# Patient Record
Sex: Female | Born: 1960 | Race: Black or African American | Hispanic: No | Smoking: Current every day smoker
Health system: Southern US, Community
[De-identification: ages and names within clinical notes are randomized; demographics above are authoritative.]

## PROBLEM LIST (undated history)

## (undated) DIAGNOSIS — J45909 Unspecified asthma, uncomplicated: Secondary | ICD-10-CM

## (undated) DIAGNOSIS — Z720 Tobacco use: Secondary | ICD-10-CM

## (undated) DIAGNOSIS — I10 Essential (primary) hypertension: Secondary | ICD-10-CM

## (undated) DIAGNOSIS — M1990 Unspecified osteoarthritis, unspecified site: Secondary | ICD-10-CM

## (undated) DIAGNOSIS — K760 Fatty (change of) liver, not elsewhere classified: Secondary | ICD-10-CM

## (undated) DIAGNOSIS — E079 Disorder of thyroid, unspecified: Secondary | ICD-10-CM

## (undated) HISTORY — DX: Fatty (change of) liver, not elsewhere classified: K76.0

## (undated) HISTORY — PX: COLPOSCOPY: SHX161

## (undated) HISTORY — DX: Unspecified asthma, uncomplicated: J45.909

## (undated) HISTORY — DX: Unspecified osteoarthritis, unspecified site: M19.90

## (undated) HISTORY — DX: Tobacco use: Z72.0

## (undated) HISTORY — PX: TENDON RELEASE: SHX230

## (undated) HISTORY — PX: OTHER SURGICAL HISTORY: SHX169

## (undated) HISTORY — DX: Disorder of thyroid, unspecified: E07.9

## (undated) HISTORY — DX: Essential (primary) hypertension: I10

---

## 2000-05-19 HISTORY — PX: TUBAL LIGATION: SHX77

## 2000-05-19 HISTORY — PX: HERNIA REPAIR: SHX51

## 2004-09-13 ENCOUNTER — Emergency Department: Payer: Self-pay | Admitting: Emergency Medicine

## 2006-08-17 ENCOUNTER — Emergency Department: Payer: Self-pay | Admitting: Emergency Medicine

## 2010-08-13 ENCOUNTER — Ambulatory Visit: Payer: Self-pay | Admitting: Specialist

## 2011-03-28 ENCOUNTER — Ambulatory Visit: Payer: Self-pay | Admitting: Specialist

## 2011-04-04 ENCOUNTER — Ambulatory Visit: Payer: Self-pay | Admitting: Specialist

## 2011-08-15 ENCOUNTER — Emergency Department: Payer: Self-pay | Admitting: Emergency Medicine

## 2012-02-26 ENCOUNTER — Ambulatory Visit: Payer: Self-pay | Admitting: Orthopedic Surgery

## 2013-12-28 ENCOUNTER — Emergency Department: Payer: Self-pay | Admitting: Emergency Medicine

## 2013-12-28 LAB — CK TOTAL AND CKMB (NOT AT ARMC)
CK, Total: 95 U/L
CK-MB: 0.6 ng/mL (ref 0.5–3.6)

## 2013-12-28 LAB — COMPREHENSIVE METABOLIC PANEL
AST: 34 U/L (ref 15–37)
Albumin: 3.5 g/dL (ref 3.4–5.0)
Alkaline Phosphatase: 108 U/L
Anion Gap: 7 (ref 7–16)
BUN: 15 mg/dL (ref 7–18)
Bilirubin,Total: 0.3 mg/dL (ref 0.2–1.0)
CO2: 26 mmol/L (ref 21–32)
Calcium, Total: 8.9 mg/dL (ref 8.5–10.1)
Chloride: 106 mmol/L (ref 98–107)
Creatinine: 0.9 mg/dL (ref 0.60–1.30)
EGFR (African American): >60
EGFR (Non-African Amer.): >60
Glucose: 111 mg/dL — ABNORMAL HIGH (ref 65–99)
OSMOLALITY: 279 (ref 275–301)
Potassium: 4.1 mmol/L (ref 3.5–5.1)
SGPT (ALT): 37 U/L
SODIUM: 139 mmol/L (ref 136–145)
TOTAL PROTEIN: 7.6 g/dL (ref 6.4–8.2)

## 2013-12-28 LAB — CBC
HCT: 39.4 % (ref 35.0–47.0)
HGB: 13.3 g/dL (ref 12.0–16.0)
MCH: 29 pg (ref 26.0–34.0)
MCHC: 33.8 g/dL (ref 32.0–36.0)
MCV: 86 fL (ref 80–100)
Platelet: 190 10*3/uL (ref 150–440)
RBC: 4.59 10*6/uL (ref 3.80–5.20)
RDW: 14 % (ref 11.5–14.5)
WBC: 9.8 10*3/uL (ref 3.6–11.0)

## 2013-12-28 LAB — APTT: Activated PTT: 26.3 secs (ref 23.6–35.9)

## 2013-12-28 LAB — TROPONIN I
Troponin-I: <0.02 ng/mL
Troponin-I: <0.02 ng/mL

## 2013-12-28 LAB — D-DIMER(ARMC): D-Dimer: 1159 ng/ml

## 2014-09-01 ENCOUNTER — Ambulatory Visit: Admit: 2014-09-01 | Disposition: A | Discharge status: Home / Self Care | Payer: Self-pay | Admitting: Family Medicine

## 2014-11-15 ENCOUNTER — Encounter: Payer: Self-pay | Admitting: *Deleted

## 2014-11-16 ENCOUNTER — Ambulatory Visit: Payer: 59 | Admitting: Anesthesiology

## 2014-11-16 ENCOUNTER — Ambulatory Visit
Admission: RE | Admit: 2014-11-16 | Discharge: 2014-11-16 | Disposition: A | Discharge status: Home / Self Care | Payer: 59 | Source: Ambulatory Visit | Attending: Gastroenterology | Admitting: Gastroenterology

## 2014-11-16 ENCOUNTER — Encounter
Admission: RE | Disposition: A | Discharge status: Home / Self Care | Payer: Self-pay | Source: Ambulatory Visit | Attending: Gastroenterology

## 2014-11-16 ENCOUNTER — Encounter: Payer: Self-pay | Admitting: *Deleted

## 2014-11-16 DIAGNOSIS — J449 Chronic obstructive pulmonary disease, unspecified: Secondary | ICD-10-CM | POA: Diagnosis not present

## 2014-11-16 DIAGNOSIS — Z6835 Body mass index (BMI) 35.0-35.9, adult: Secondary | ICD-10-CM | POA: Insufficient documentation

## 2014-11-16 DIAGNOSIS — F17210 Nicotine dependence, cigarettes, uncomplicated: Secondary | ICD-10-CM | POA: Insufficient documentation

## 2014-11-16 DIAGNOSIS — Z79899 Other long term (current) drug therapy: Secondary | ICD-10-CM | POA: Diagnosis not present

## 2014-11-16 DIAGNOSIS — D125 Benign neoplasm of sigmoid colon: Secondary | ICD-10-CM | POA: Diagnosis not present

## 2014-11-16 DIAGNOSIS — D123 Benign neoplasm of transverse colon: Secondary | ICD-10-CM | POA: Insufficient documentation

## 2014-11-16 DIAGNOSIS — I10 Essential (primary) hypertension: Secondary | ICD-10-CM | POA: Diagnosis not present

## 2014-11-16 DIAGNOSIS — K621 Rectal polyp: Secondary | ICD-10-CM | POA: Insufficient documentation

## 2014-11-16 DIAGNOSIS — Z1211 Encounter for screening for malignant neoplasm of colon: Secondary | ICD-10-CM | POA: Insufficient documentation

## 2014-11-16 DIAGNOSIS — D122 Benign neoplasm of ascending colon: Secondary | ICD-10-CM | POA: Diagnosis not present

## 2014-11-16 HISTORY — PX: COLONOSCOPY WITH PROPOFOL: SHX5780

## 2014-11-16 HISTORY — DX: Essential (primary) hypertension: I10

## 2014-11-16 SURGERY — COLONOSCOPY WITH PROPOFOL
Anesthesia: General

## 2014-11-16 MED ORDER — SODIUM CHLORIDE 0.9 % IV SOLN
INTRAVENOUS | Status: DC
  Administered 2014-11-16: 09:00:00 via INTRAVENOUS

## 2014-11-16 MED ORDER — MIDAZOLAM HCL 2 MG/2ML IJ SOLN
INTRAMUSCULAR | Status: DC | PRN
  Administered 2014-11-16: 1 mg via INTRAVENOUS

## 2014-11-16 MED ORDER — FENTANYL CITRATE (PF) 100 MCG/2ML IJ SOLN
INTRAMUSCULAR | Status: DC | PRN
  Administered 2014-11-16: 50 ug via INTRAVENOUS

## 2014-11-16 MED ORDER — PROPOFOL INFUSION 10 MG/ML OPTIME
INTRAVENOUS | Status: DC | PRN
  Administered 2014-11-16: 140 ug/kg/min via INTRAVENOUS

## 2014-11-16 NOTE — Op Note (Signed)
[facility] Patient Name: Brittany Castro Procedure Date: 11/16/2014 9:08 AM MRN: [id number] Account #: [id number] Date of Birth: 13-Aug-1960 Admit Type: Outpatient Age: 54 Room: [facility] ENDO ROOM 4 Gender: Female Note Status: Finalized Procedure:         Colonoscopy Indications:       Screening for colorectal malignant neoplasm Providers:         Lucilla Lame, MD Referring MD:      Bobetta Lime (Referring MD) Medicines:         Propofol per Anesthesia Complications:     No immediate complications. Procedure:         Pre-Anesthesia Assessment:                    - Prior to the procedure, a History and Physical was                     performed, and patient medications and allergies were                     reviewed. The patient's tolerance of previous anesthesia                     was also reviewed. The risks and benefits of the procedure                     and the sedation options and risks were discussed with the                     patient. All questions were answered, and informed consent                     was obtained. Prior Anticoagulants: The patient has taken                     no previous anticoagulant or antiplatelet agents. ASA                     Grade Assessment: II - A patient with mild systemic                     disease. After reviewing the risks and benefits, the                     patient was deemed in satisfactory condition to undergo                     the procedure.                    After obtaining informed consent, the colonoscope was                     passed under direct vision. Throughout the procedure, the                     patient's blood pressure, pulse, and oxygen saturations                     were monitored continuously. The Colonoscope was                     introduced through the anus and advanced to the the cecum,  identified by appendiceal orifice and ileocecal valve. The          colonoscopy was performed without difficulty. The patient                     tolerated the procedure well. The quality of the bowel                     preparation was excellent. Findings:      The perianal and digital rectal examinations were normal.      A 5 mm polyp was found in the ascending colon. The polyp was sessile.       The polyp was removed with a cold snare. Resection and retrieval were       complete.      A 4 mm polyp was found at the hepatic flexure. The polyp was sessile.       The polyp was removed with a cold biopsy forceps. Resection and       retrieval were complete.      A 5 mm polyp was found in the sigmoid colon. The polyp was sessile. The       polyp was removed with a cold snare. Resection and retrieval were       complete.      A 3 mm polyp was found in the rectum. The polyp was sessile. The polyp       was removed with a cold biopsy forceps. Resection and retrieval were       complete. Impression:        - One 5 mm polyp in the ascending colon. Resected and                     retrieved.                    - One 4 mm polyp at the hepatic flexure. Resected and                     retrieved.                    - One 5 mm polyp in the sigmoid colon. Resected and                     retrieved.                    - One 3 mm polyp in the rectum. Resected and retrieved. Recommendation:    - Await pathology results.                    - Repeat colonoscopy in 5 years if polyp adenoma and 10                     years if hyperplastic Procedure Code(s): --- Professional ---                    [phone], Colonoscopy, flexible; with removal of tumor(s),                     polyp(s), or other lesion(s) by snare technique                    73428, 37, Colonoscopy, flexible; with biopsy, single or  multiple Diagnosis Code(s): --- Professional ---                    Z12.11, Encounter for screening for malignant neoplasm of                     colon                     D12.2, Benign neoplasm of ascending colon                    D12.3, Benign neoplasm of transverse colon                    D12.5, Benign neoplasm of sigmoid colon                    K62.1, Rectal polyp CPT copyright 2014 American Medical Association. All rights reserved. The codes documented in this report are preliminary and upon coder review may  be revised to meet current compliance requirements. Lucilla Lame, MD 11/16/2014 9:24:34 AM This report has been signed electronically. Number of Addenda: 0 Note Initiated On: 11/16/2014 9:08 AM Scope Withdrawal Time: 0 hours 6 minutes 55 seconds  Total Procedure Duration: 0 hours 8 minutes 33 seconds       [facility]

## 2014-11-16 NOTE — Transfer of Care (Signed)
Immediate Anesthesia Transfer of Care Note  Patient: Brittany Castro  Procedure(s) Performed: Procedure(s): COLONOSCOPY WITH PROPOFOL (N/A)  Patient Location: PACU  Anesthesia Type:General  Level of Consciousness: awake and sedated  Airway & Oxygen Therapy: Patient Spontanous Breathing and Patient connected to nasal cannula oxygen  Post-op Assessment: Report given to RN and Post -op Vital signs reviewed and stable  Post vital signs: Reviewed and stable  Last Vitals:  Filed Vitals:   11/16/14 0832  BP: 174/111  Pulse: 99  Temp: 35.6 C  Resp: 18    Complications: No apparent anesthesia complications

## 2014-11-16 NOTE — H&P (Signed)
  [facility]  [address], [place], [place] [address] Phone: [phone] Fax : [phone]  Primary Care Physician:  Bobetta Lime, MD Primary Gastroenterologist:  Dr. Allen Norris  Pre-Procedure History & Physical: HPI:  Brittany Castro is a 54 y.o. female is here for a screening colonoscopy.   Past Medical History  Diagnosis Date  . Hypertension     Past Surgical History  Procedure Laterality Date  . Carpel tunnel    . Tendon release      Prior to Admission medications   Medication Sig Start Date End Date Taking? Authorizing Provider  cyclobenzaprine (FLEXERIL) 5 MG tablet Take 5 mg by mouth 3 (three) times daily as needed for muscle spasms.   Yes Historical Provider, MD  hydrochlorothiazide (HYDRODIURIL) 50 MG tablet Take 50 mg by mouth daily.   Yes Historical Provider, MD  lisinopril (PRINIVIL,ZESTRIL) 20 MG tablet Take 20 mg by mouth daily.   Yes Historical Provider, MD  meloxicam (MOBIC) 15 MG tablet Take 15 mg by mouth daily.   Yes Historical Provider, MD    Allergies as of 10/12/2014  . (Not on File)    History reviewed. No pertinent family history.  History   Social History  . Marital Status: Divorced    Spouse Name: N/A  . Number of Children: N/A  . Years of Education: N/A   Occupational History  . Not on file.   Social History Main Topics  . Smoking status: Current Every Day Smoker -- 0.25 packs/day for 20 years    Types: Cigarettes  . Smokeless tobacco: Not on file  . Alcohol Use: No  . Drug Use: No  . Sexual Activity: Not on file   Other Topics Concern  . Not on file   Social History Narrative    Review of Systems: See HPI, otherwise negative ROS  Physical Exam: BP 174/111 mmHg  Pulse 99  Temp(Src) 96 F (35.6 C) (Tympanic)  Resp 18  Ht 5\' 4"  (1.626 m)  Wt 205 lb (92.987 kg)  BMI 35.17 kg/m2  SpO2 99% General:   Alert,  pleasant and cooperative in NAD Head:  Normocephalic and atraumatic. Neck:  Supple; no masses  or thyromegaly. Lungs:  Clear throughout to auscultation.    Heart:  Regular rate and rhythm. Abdomen:  Soft, nontender and nondistended. Normal bowel sounds, without guarding, and without rebound.   Neurologic:  Alert and  oriented x4;  grossly normal neurologically.  Impression/Plan: MAEVA DANT is now here to undergo a screening colonoscopy.  Risks, benefits, and alternatives regarding colonoscopy have been reviewed with the patient.  Questions have been answered.  All parties agreeable.

## 2014-11-16 NOTE — Anesthesia Postprocedure Evaluation (Signed)
  Anesthesia Post-op Note  Patient: Brittany Castro  Procedure(s) Performed: Procedure(s): COLONOSCOPY WITH PROPOFOL (N/A)  Anesthesia type:General  Patient location: PACU  Post pain: Pain level controlled  Post assessment: Post-op Vital signs reviewed, Patient's Cardiovascular Status Stable, Respiratory Function Stable, Patent Airway and No signs of Nausea or vomiting  Post vital signs: Reviewed and stable  Last Vitals:  Filed Vitals:   11/16/14 0832  BP: 174/111  Pulse: 99  Temp: 35.6 C  Resp: 18    Level of consciousness: awake, alert  and patient cooperative  Complications: No apparent anesthesia complications

## 2014-11-16 NOTE — Anesthesia Preprocedure Evaluation (Signed)
Anesthesia Evaluation  Patient identified by MRN, date of birth, ID band Patient confused    Reviewed: Allergy & Precautions, NPO status , Patient's Chart, lab work & pertinent test results  History of Anesthesia Complications Negative for: history of anesthetic complications  Airway Mallampati: III       Dental no notable dental hx. (+) Teeth Intact   Pulmonary COPDCurrent Smoker,    Pulmonary exam normal       Cardiovascular hypertension, Pt. on medications Normal cardiovascular exam    Neuro/Psych negative neurological ROS     GI/Hepatic Neg liver ROS,   Endo/Other  Morbid obesity  Renal/GU   negative genitourinary   Musculoskeletal negative musculoskeletal ROS (+)   Abdominal Normal abdominal exam  (+)   Peds negative pediatric ROS (+)  Hematology   Anesthesia Other Findings   Reproductive/Obstetrics negative OB ROS                             Anesthesia Physical Anesthesia Plan  ASA: III  Anesthesia Plan: General   Post-op Pain Management:    Induction: Intravenous  Airway Management Planned: Nasal Cannula  Additional Equipment:   Intra-op Plan:   Post-operative Plan:   Informed Consent: I have reviewed the patients History and Physical, chart, labs and discussed the procedure including the risks, benefits and alternatives for the proposed anesthesia with the patient or authorized representative who has indicated his/her understanding and acceptance.     Plan Discussed with: CRNA  Anesthesia Plan Comments:         Anesthesia Quick Evaluation

## 2014-11-16 NOTE — Anesthesia Procedure Notes (Signed)
Performed by: COOK-MARTIN, Josph [place] Pre-anesthesia Checklist: Patient identified, Emergency Drugs available, Suction available, Patient being monitored and Timeout performed Patient Re-evaluated:Patient Re-evaluated prior to inductionOxygen Delivery Method: Nasal cannula Preoxygenation: Pre-oxygenation with 100% oxygen Intubation Type: IV induction Placement Confirmation: positive ETCO2 and CO2 detector       

## 2014-11-17 ENCOUNTER — Encounter: Payer: Self-pay | Admitting: Gastroenterology

## 2014-11-21 ENCOUNTER — Encounter: Payer: Self-pay | Admitting: Gastroenterology

## 2014-11-21 LAB — SURGICAL PATHOLOGY

## 2015-03-13 ENCOUNTER — Encounter: Payer: Self-pay | Admitting: Family Medicine

## 2015-03-13 ENCOUNTER — Ambulatory Visit (INDEPENDENT_AMBULATORY_CARE_PROVIDER_SITE_OTHER): Payer: 59 | Admitting: Family Medicine

## 2015-03-13 VITALS — BP 122/80 | HR 91 | Temp 98.0°F | Resp 18 | Wt 213.9 lb

## 2015-03-13 DIAGNOSIS — J069 Acute upper respiratory infection, unspecified: Secondary | ICD-10-CM | POA: Diagnosis not present

## 2015-03-13 DIAGNOSIS — E669 Obesity, unspecified: Secondary | ICD-10-CM

## 2015-03-13 DIAGNOSIS — J029 Acute pharyngitis, unspecified: Secondary | ICD-10-CM | POA: Insufficient documentation

## 2015-03-13 MED ORDER — BENZONATATE 200 MG PO CAPS: 200.0000 mg | ORAL_CAPSULE | Freq: Three times a day (TID) | ORAL | Status: DC | PRN

## 2015-03-13 MED ORDER — PREDNISONE 20 MG PO TABS: 20.0000 mg | ORAL_TABLET | Freq: Two times a day (BID) | ORAL | Status: DC

## 2015-03-13 NOTE — Progress Notes (Signed)
Name: Brittany Castro   MRN: [id number]    DOB: 1961/01/26   Date:03/13/2015       Progress Note  Subjective  Chief Complaint  Chief Complaint  Patient presents with  . URI    patient has had a cold for about 2 weeks. it has been nonresponsive to otc meds.  . Advice Only    patient is here for her BMI screening.    HPI  Patient is here today with concerns regarding the following symptoms congestion, post nasal drip, non productive cough and laryngitis that started about 2 weeks ago.  Associated with fatigue and malaise. Has tried the following home remedies: OTC NyQuil and "home brew with honey and lemon," but her symptoms persists.  She is also requesting [facility] form to be filled out as she did not meet BMI reduction requirements. On review of previous office visits she weighs 208-215 lbs as her typical range. She has not lost any weight. Her diet is reasonably good during breakfast and lunch but her dinner is heavy. She works 3rd shift. Has not made time to exercise. She likes fruits, vegetables but does eat pasta a lot.  Past Medical History  Diagnosis Date  . Hypertension     Social History  Substance Use Topics  . Smoking status: Current Every Day Smoker -- 0.25 packs/day for 20 years    Types: Cigarettes  . Smokeless tobacco: Not on file  . Alcohol Use: No     Current outpatient prescriptions:  .  cyclobenzaprine (FLEXERIL) 5 MG tablet, Take 5 mg by mouth 3 (three) times daily as needed for muscle spasms., Disp: , Rfl:  .  hydrochlorothiazide (HYDRODIURIL) 50 MG tablet, Take 50 mg by mouth daily., Disp: , Rfl:  .  lisinopril (PRINIVIL,ZESTRIL) 20 MG tablet, Take 20 mg by mouth daily., Disp: , Rfl:  .  meloxicam (MOBIC) 15 MG tablet, Take 15 mg by mouth daily., Disp: , Rfl:   No Known Allergies  ROS  CONSTITUTIONAL: No significant weight changes, fever, chills, weakness. Yes fatigue.  HEENT:  - Eyes: No visual changes.  - Ears: No auditory changes. No pain.  -  Nose: Yes sneezing, congestion, runny nose. - Throat: No sore throat. No changes in swallowing. SKIN: No rash or itching.  CARDIOVASCULAR: No chest pain, chest pressure or chest discomfort. No palpitations or edema.  RESPIRATORY: No shortness of breath. Yes cough without sputum.  GASTROINTESTINAL: No anorexia, nausea, vomiting. No changes in bowel habits. No abdominal pain or blood.  MUSCULOSKELETAL: No joint pain. No muscle pain. LYMPHATICS: No enlarged lymph nodes.  PSYCHIATRIC: No change in mood. No change in sleep pattern.     Objective  Filed Vitals:   03/13/15 0952  BP: 122/80  Pulse: 91  Temp: 98 F (36.7 C)  TempSrc: Oral  Resp: 18  Weight: 213 lb 14.4 oz (97.024 kg)  SpO2: 98%   Body mass index is 36.7 kg/(m^2).   Physical Exam  Constitutional: Patient is obese and well-nourished. In no acute distress but does appear to be fatigued from acute illness. HEENT:  - Head: Normocephalic and atraumatic.  - Ears: RIGHT TM bulging with minimal clear exudate, LEFT TM bulging with minimal clear exudate.  - Nose: Nasal mucosa boggy and congested.  - Mouth/Throat: Oropharynx is moist with slight erythema of bilateral tonsils without hypertrophy or exudates. Post nasal drainage present.  - Eyes: Conjunctivae clear, EOM movements normal. PERRLA. No scleral icterus.  Neck: Normal range of motion. Neck supple.  No JVD present. No thyromegaly present. No local lymphadenopathy. Cardiovascular: Regular rate, regular rhythm with no murmurs heard.  Pulmonary/Chest: Effort normal and breath sounds clear in all lung fields.  Musculoskeletal: Normal range of motion bilateral UE and LE, no joint effusions. Skin: Skin is warm and dry. No rash noted. Psychiatric: Patient has a normal mood and affect. Behavior is normal in office today. Judgment and thought content normal in office today.   Assessment & Plan  1. URI (upper respiratory infection) Likely viral etiology. Instructed patient on  increasing hydration, nasal saline spray, steam inhalation, NSAID if tolerated and not contraindicated. If not already doing so start taking daily anti-histamine and use a steroid nasal spray. If symptoms persist/worsen may consider antibiotic therapy.  - benzonatate (TESSALON) 200 MG capsule; Take 1 capsule (200 mg total) by mouth 3 (three) times daily as needed for cough.  Dispense: 30 capsule; Refill: 0 - predniSONE (DELTASONE) 20 MG tablet; Take 1 tablet (20 mg total) by mouth 2 (two) times daily with a meal.  Dispense: 6 tablet; Refill: 0  2. Obesity, Class II, BMI 35-39.9 Recommended at least 5% weight loss in 6 months (about 10 lbs). Recommended 1600 kcal/day diet. The patient has been counseled on their higher than normal BMI.  They have verbally expressed understanding their increased risk for other diseases.  In efforts to meet a better target BMI goal the patient has been counseled on lifestyle, diet and exercise modification tactics. Start with moderate intensity aerobic exercise (walking, jogging, elliptical, swimming, group or individual sports, hiking) at least 15mins a day at least 4 days a week and increase intensity, duration, frequency as tolerated. Diet should include well balance fresh fruits and vegetables avoiding processed foods, carbohydrates and sugars. Drink at least 8oz 10 glasses a day avoiding sodas, sugary fruit drinks, sweetened tea. Check weight on a reliable scale daily and monitor weight loss progress daily. Consider investing in mobile phone apps that will help keep track of weight loss goals.

## 2015-03-13 NOTE — Patient Instructions (Signed)
Fat and Cholesterol Restricted Diet High levels of fat and cholesterol in your blood may lead to various health problems, such as diseases of the heart, blood vessels, gallbladder, liver, and pancreas. Fats are concentrated sources of energy that come in various forms. Certain types of fat, including saturated fat, may be harmful in excess. Cholesterol is a substance needed by your body in small amounts. Your body makes all the cholesterol it needs. Excess cholesterol comes from the food you eat. When you have high levels of cholesterol and saturated fat in your blood, health problems can develop because the excess fat and cholesterol will gather along the walls of your blood vessels, causing them to narrow. Choosing the right foods will help you control your intake of fat and cholesterol. This will help keep the levels of these substances in your blood within normal limits and reduce your risk of disease.   WHAT TYPES OF FAT SHOULD I CHOOSE?  Choose healthy fats more often. Choose monounsaturated and polyunsaturated fats, such as olive and canola oil, flaxseeds, walnuts, almonds, and seeds.  Eat more omega-3 fats. Good choices include salmon, mackerel, sardines, tuna, flaxseed oil, and ground flaxseeds. Aim to eat fish at least two times a week.  Limit saturated fats. Saturated fats are primarily found in animal products, such as meats, butter, and cream. Plant sources of saturated fats include palm oil, palm kernel oil, and coconut oil.  Avoid foods with partially hydrogenated oils in them. These contain trans fats. Examples of foods that contain trans fats are stick margarine, some tub margarines, cookies, crackers, and other baked goods. WHAT GENERAL GUIDELINES DO I NEED TO FOLLOW? These guidelines for healthy eating will help you control your intake of fat and cholesterol:  Check food labels carefully to identify foods with trans fats or high amounts of saturated fat.  Fill one half of your  plate with vegetables and green salads.  Fill one fourth of your plate with whole grains. Look for the word "whole" as the first word in the ingredient list.  Fill one fourth of your plate with lean protein foods.  Limit fruit to two servings a day. Choose fruit instead of juice.  Eat more foods that contain soluble fiber. Examples of foods that contain this type of fiber are apples, broccoli, carrots, beans, peas, and barley. Aim to get 20-30 g of fiber per day.  Eat more home-cooked food and less restaurant, buffet, and fast food.  Limit or avoid alcohol.  Limit foods high in starch and sugar.  Limit fried foods.  Cook foods using methods other than frying. Baking, boiling, grilling, and broiling are all great options.  Lose weight if you are overweight. Losing just 5-10% of your initial body weight can help your overall health and prevent diseases such as diabetes and heart disease. WHAT FOODS CAN I EAT? Grains Whole grains, such as whole wheat or whole grain breads, crackers, cereals, and pasta. Unsweetened oatmeal, bulgur, barley, quinoa, or brown rice. Corn or whole wheat flour tortillas. Vegetables Fresh or frozen vegetables (raw, steamed, roasted, or grilled). Green salads. Fruits All fresh, canned (in natural juice), or frozen fruits. Meat and Other Protein Products Ground beef (85% or leaner), grass-fed beef, or beef trimmed of fat. Skinless chicken or [place]. Ground chicken or [place]. Pork trimmed of fat. All fish and seafood. Eggs. Dried beans, peas, or lentils. Unsalted nuts or seeds. Unsalted canned or dry beans. Dairy Low-fat dairy products, such as skim or 1% milk, 2% or  reduced-fat cheeses, low-fat ricotta or cottage cheese, or plain low-fat yogurt. Fats and Oils Tub margarines without trans fats. Light or reduced-fat mayonnaise and salad dressings. Avocado. Olive, canola, sesame, or safflower oils. Natural peanut or almond butter (choose ones without added sugar and  oil). The items listed above may not be a complete list of recommended foods or beverages. Contact your dietitian for more options. WHAT FOODS ARE NOT RECOMMENDED? Grains White bread. White pasta. White rice. Cornbread. Bagels, pastries, and croissants. Crackers that contain trans fat. Vegetables White potatoes. Corn. Creamed or fried vegetables. Vegetables in a cheese sauce. Fruits Dried fruits. Canned fruit in light or heavy syrup. Fruit juice. Meat and Other Protein Products Fatty cuts of meat. Ribs, chicken wings, bacon, sausage, bologna, salami, chitterlings, fatback, hot dogs, bratwurst, and packaged luncheon meats. Liver and organ meats. Dairy Whole or 2% milk, cream, half-and-half, and cream cheese. Whole milk cheeses. Whole-fat or sweetened yogurt. Full-fat cheeses. Nondairy creamers and whipped toppings. Processed cheese, cheese spreads, or cheese curds. Sweets and Desserts Corn syrup, sugars, honey, and molasses. Candy. Jam and jelly. Syrup. Sweetened cereals. Cookies, pies, cakes, donuts, muffins, and ice cream. Fats and Oils Butter, stick margarine, lard, shortening, ghee, or bacon fat. Coconut, palm kernel, or palm oils. Beverages Alcohol. Sweetened drinks (such as sodas, lemonade, and fruit drinks or punches). The items listed above may not be a complete list of foods and beverages to avoid. Contact your dietitian for more information.   This information is not intended to replace advice given to you by your health care provider. Make sure you discuss any questions you have with your health care provider.   Document Released: 05/05/2005 Document Revised: 05/26/2014 Document Reviewed: 08/03/2013 Elsevier Interactive Patient Education [employer].

## 2015-04-09 ENCOUNTER — Ambulatory Visit: Payer: Self-pay | Admitting: Family Medicine

## 2015-05-22 ENCOUNTER — Ambulatory Visit
Admission: RE | Admit: 2015-05-22 | Discharge: 2015-05-22 | Disposition: A | Discharge status: Home / Self Care | Payer: 59 | Source: Ambulatory Visit | Attending: Family Medicine | Admitting: Family Medicine

## 2015-05-22 ENCOUNTER — Ambulatory Visit (INDEPENDENT_AMBULATORY_CARE_PROVIDER_SITE_OTHER): Payer: 59 | Admitting: Family Medicine

## 2015-05-22 ENCOUNTER — Encounter: Payer: Self-pay | Admitting: Family Medicine

## 2015-05-22 VITALS — BP 124/84 | HR 103 | Temp 98.3°F | Resp 16 | Ht 64.0 in | Wt 211.9 lb

## 2015-05-22 DIAGNOSIS — J40 Bronchitis, not specified as acute or chronic: Secondary | ICD-10-CM | POA: Insufficient documentation

## 2015-05-22 DIAGNOSIS — J209 Acute bronchitis, unspecified: Secondary | ICD-10-CM

## 2015-05-22 DIAGNOSIS — B001 Herpesviral vesicular dermatitis: Secondary | ICD-10-CM | POA: Diagnosis not present

## 2015-05-22 MED ORDER — HYDROCOD POLST-CPM POLST ER 10-8 MG/5ML PO SUER: 5.0000 mL | Freq: Two times a day (BID) | ORAL | Status: DC | PRN

## 2015-05-22 MED ORDER — AMOXICILLIN-POT CLAVULANATE 875-125 MG PO TABS: 1.0000 | ORAL_TABLET | Freq: Two times a day (BID) | ORAL | Status: DC

## 2015-05-22 MED ORDER — PREDNISONE 10 MG (21) PO TBPK: ORAL_TABLET | ORAL | Status: DC

## 2015-05-22 NOTE — Patient Instructions (Signed)

## 2015-05-22 NOTE — Progress Notes (Signed)
Name: Brittany Castro   MRN: [id number]    DOB: [birth year]   Date:05/22/2015       Progress Note  Subjective  Chief Complaint  Chief Complaint  Patient presents with  . URI    HPI  Patient is here today with concerns regarding the following symptoms congestion, sneezing, productive cough and achiness that started 6 days ago.  Associated with chills, fatigue and malaise. Not associated with fever, recent travel or rash. Hs not tried anything OTC.   Today onset of lesion on upper lip and lower lip.   Past Medical History  Diagnosis Date  . Hypertension     Social History  Substance Use Topics  . Smoking status: Current Every Day Smoker -- 0.25 packs/day for 20 years    Types: Cigarettes  . Smokeless tobacco: Not on file  . Alcohol Use: No     Current outpatient prescriptions:  .  cyclobenzaprine (FLEXERIL) 5 MG tablet, Take 5 mg by mouth 3 (three) times daily as needed for muscle spasms., Disp: , Rfl:  .  hydrochlorothiazide (HYDRODIURIL) 50 MG tablet, Take 50 mg by mouth daily., Disp: , Rfl:  .  lisinopril (PRINIVIL,ZESTRIL) 20 MG tablet, Take 20 mg by mouth daily., Disp: , Rfl:   No Known Allergies  ROS  Positive for fatigue, nasal congestion, sinus pressure, ear fullness, cough as mentioned in HPI, otherwise all systems reviewed and are negative.  Objective  Filed Vitals:   05/22/15 1441  BP: 124/84  Pulse: 103  Temp: 98.3 F (36.8 C)  TempSrc: Oral  Resp: 16  Height: 5\' 4"  (1.626 m)  Weight: 211 lb 14.4 oz (96.117 kg)  SpO2: 96%   Body mass index is 36.35 kg/(m^2).   Physical Exam  Constitutional: Patient appears well-developed and well-nourished. In no acute distress but does appear to be fatigued from acute illness. HEENT:  - Head: Normocephalic and atraumatic. Vesicular lesion left upper lip border and right lower lip border. - Ears: RIGHT TM bulging with minimal clear exudate, LEFT TM bulging with minimal clear exudate.  - Nose: Nasal mucosa boggy  and congested.  - Mouth/Throat: Oropharynx is moist with slight erythema of bilateral tonsils without hypertrophy or exudates. Post nasal drainage present.  - Eyes: Conjunctivae clear, EOM movements normal. PERRLA. No scleral icterus.  Neck: Normal range of motion. Neck supple. No JVD present. No thyromegaly present. No local lymphadenopathy. Cardiovascular: Regular rate, regular rhythm with no murmurs heard.  Pulmonary/Chest: Effort normal and breath sounds decreased in lung fields with scattered rhonchi. Musculoskeletal: Normal range of motion bilateral UE and LE, no joint effusions. Skin: Skin is warm and dry. No rash noted. Psychiatric: Patient has a normal mood and affect. Behavior is normal in office today. Judgment and thought content normal in office today.   Assessment & Plan  1. Bronchitis with bronchospasm Increase hydration, rest, NSAID or Tylenol for fevers and aches. Work note provided. Start prednisone and abx. Get CXR to rule out PNA. If wheezing continues after using prednisone for 3 days she may call me for inhaler.   - amoxicillin-clavulanate (AUGMENTIN) 875-125 MG tablet; Take 1 tablet by mouth 2 (two) times daily.  Dispense: 20 tablet; Refill: 0 - chlorpheniramine-HYDROcodone (TUSSIONEX PENNKINETIC ER) 10-8 MG/5ML SUER; Take 5 mLs by mouth every 12 (twelve) hours as needed.  Dispense: 115 mL; Refill: 0 - predniSONE (STERAPRED UNI-PAK 21 TAB) 10 MG (21) TBPK tablet; Use as directed in a 6 day taper Pred Pak  Dispense: 21 tablet; Refill:  0 - DG Chest 2 View; Future  2. Cold sore Likely has outbreak due to acute illness. Advised on using OTC abreva or equivalent. If ongoing may call me for valtrex.

## 2015-07-03 ENCOUNTER — Encounter: Payer: Self-pay | Admitting: Family Medicine

## 2015-07-03 ENCOUNTER — Ambulatory Visit (INDEPENDENT_AMBULATORY_CARE_PROVIDER_SITE_OTHER): Payer: 59 | Admitting: Family Medicine

## 2015-07-03 VITALS — BP 130/80 | HR 105 | Temp 98.6°F | Resp 16 | Ht 64.0 in | Wt 211.9 lb

## 2015-07-03 DIAGNOSIS — R1011 Right upper quadrant pain: Secondary | ICD-10-CM

## 2015-07-03 DIAGNOSIS — E669 Obesity, unspecified: Secondary | ICD-10-CM | POA: Diagnosis not present

## 2015-07-03 LAB — COMPREHENSIVE METABOLIC PANEL
ALK PHOS: 125 IU/L — AB (ref 39–117)
ALT: 28 IU/L (ref 0–32)
AST: 24 IU/L (ref 0–40)
Albumin/Globulin Ratio: 1.6 (ref 1.1–2.5)
Albumin: 4.5 g/dL (ref 3.5–5.5)
BUN/Creatinine Ratio: 11 (ref 9–23)
BUN: 9 mg/dL (ref 6–24)
Bilirubin Total: 0.4 mg/dL (ref 0.0–1.2)
CO2: 23 mmol/L (ref 18–29)
Calcium: 9.4 mg/dL (ref 8.7–10.2)
Chloride: 103 mmol/L (ref 96–106)
Creatinine, Ser: 0.8 mg/dL (ref 0.57–1.00)
GFR calc Af Amer: 97 mL/min/{1.73_m2} (ref >59)
GFR calc non Af Amer: 84 mL/min/{1.73_m2} (ref >59)
GLUCOSE: 117 mg/dL — AB (ref 65–99)
Globulin, Total: 2.8 g/dL (ref 1.5–4.5)
Potassium: 4.4 mmol/L (ref 3.5–5.2)
SODIUM: 142 mmol/L (ref 134–144)
Total Protein: 7.3 g/dL (ref 6.0–8.5)

## 2015-07-03 LAB — CBC WITH DIFFERENTIAL/PLATELET
BASOS ABS: 0 10*3/uL (ref 0.0–0.2)
Basos: 0 %
EOS (ABSOLUTE): 0.1 10*3/uL (ref 0.0–0.4)
Eos: 1 %
Hematocrit: 41.3 % (ref 34.0–46.6)
Hemoglobin: 14.4 g/dL (ref 11.1–15.9)
Immature Grans (Abs): 0 10*3/uL (ref 0.0–0.1)
Immature Granulocytes: 0 %
LYMPHS ABS: 2.7 10*3/uL (ref 0.7–3.1)
Lymphs: 25 %
MCH: 29.1 pg (ref 26.6–33.0)
MCHC: 34.9 g/dL (ref 31.5–35.7)
MCV: 84 fL (ref 79–97)
MONOCYTES: 7 %
Monocytes Absolute: 0.8 10*3/uL (ref 0.1–0.9)
Neutrophils Absolute: 7 10*3/uL (ref 1.4–7.0)
Neutrophils: 67 %
Platelets: 200 10*3/uL (ref 150–379)
RBC: 4.94 x10E6/uL (ref 3.77–5.28)
RDW: 15 % (ref 12.3–15.4)
WBC: 10.6 10*3/uL (ref 3.4–10.8)

## 2015-07-03 NOTE — Progress Notes (Signed)
Name: Brittany Castro   MRN: [id number]    DOB: 10-01-1960   Date:07/03/2015       Progress Note  Subjective  Chief Complaint  Chief Complaint  Patient presents with  . Abdominal Pain    RUQ onset several months and worsening. Patient states it feels like a catching when she turns a certain way.    HPI  Brittany Castro is a 55 year old female with complaints of several weeks of progressively worsening RUQ abdominal pain. Described as crampy and sharp. Initially the pain would come and go but now it is more constant. Not associated with nausea, vomiting, change in bowel movements but she does not have the same appetite. Working night shifts, pain is there day and night. Diet consists of high carb, fat foods.    Past Medical History  Diagnosis Date  . Hypertension     Patient Active Problem List   Diagnosis Date Noted  . Bronchitis with bronchospasm 05/22/2015  . Cold sore 05/22/2015  . Obesity, Class II, BMI 35-39.9 03/13/2015  . Special screening for malignant neoplasms, colon   . Benign neoplasm of ascending colon   . Benign neoplasm of transverse colon   . Benign neoplasm of sigmoid colon   . Rectal polyp     Social History  Substance Use Topics  . Smoking status: Current Every Day Smoker -- 0.25 packs/day for 20 years    Types: Cigarettes  . Smokeless tobacco: Not on file  . Alcohol Use: No   Past Surgical History  Procedure Laterality Date  . Carpel tunnel    . Tendon release    . Colonoscopy with propofol N/A 11/16/2014    Procedure: COLONOSCOPY WITH PROPOFOL;  Surgeon: Lucilla Lame, MD;  Location: [facility] ENDOSCOPY;  Service: Endoscopy;  Laterality: N/A;    No current outpatient prescriptions on file.  No Known Allergies  Review of Systems  CONSTITUTIONAL: No significant weight changes, fever, chills, weakness or fatigue.  SKIN: No rash or itching.  CARDIOVASCULAR: No chest pain, chest pressure or chest discomfort. No palpitations or edema.  RESPIRATORY: No  shortness of breath, cough or sputum.  GASTROINTESTINAL: No nausea, vomiting. No changes in bowel habits. Yes abdominal pain. GENITOURINARY: No dysuria. No frequency. No discharge. NEUROLOGICAL: No headache, dizziness, syncope, paralysis, ataxia, numbness or tingling in the extremities. No memory changes. No change in bowel or bladder control.    Objective  BP 130/80 mmHg  Pulse 105  Temp(Src) 98.6 F (37 C) (Oral)  Resp 16  Ht 5\' 4"  (1.626 m)  Wt 211 lb 14.4 oz (96.117 kg)  BMI 36.35 kg/m2  SpO2 97%  Body mass index is 36.35 kg/(m^2).   Physical Exam  Constitutional: Patient is obese and well-nourished. In no distress.   Neck: Normal range of motion. Neck supple. No JVD present. No thyromegaly present.  Cardiovascular: Normal rate, regular rhythm and normal heart sounds.  No murmur heard.  Pulmonary/Chest: Effort normal and breath sounds normal. No respiratory distress. Abdomen: Soft, obese, midline ventral hernia on bearing down only, tenderness RUQ with guarding and rebound, bowel sounds normal.  Musculoskeletal: Normal range of motion bilateral UE and LE, no joint effusions. Peripheral vascular: Bilateral LE no edema. Neurological: CN II-XII grossly intact with no focal deficits. Alert and oriented to person, place, and time. Coordination, balance, strength, speech and gait are normal.  Skin: Skin is warm and dry. No rash noted. No erythema.  Psychiatric: Patient has an anxious mood and affect. Behavior is normal  in office today. Judgment and thought content normal in office today.    Assessment & Plan  1. Right upper quadrant abdominal pain Symptoms suggestive of gallbladder pathology. Diet modification, RUQ [place] and stat labs.   - US Abdomen Limited RUQ; Future - CBC with Differential/Platelet - Comprehensive metabolic panel  2. Obesity, Class II, BMI 35-39.9 Increased risk for gallbladder pathology, fatty liver, cardiovascular disorders. Needs to lose weight once  abdominal pain issues resolved.

## 2015-07-03 NOTE — Patient Instructions (Signed)
Low-Fat Diet for Pancreatitis or Gallbladder Conditions °A low-fat diet can be helpful if you have pancreatitis or a gallbladder condition. With these conditions, your pancreas and gallbladder have trouble digesting fats. A healthy eating plan with less fat will help rest your pancreas and gallbladder and reduce your symptoms. °WHAT DO I NEED TO KNOW ABOUT THIS DIET? °· Eat a low-fat diet. °¨ Reduce your fat intake to less than 20-30% of your total daily calories. This is less than 50-60 g of fat per day. °¨ Remember that you need some fat in your diet. Ask your dietician what your daily goal should be. °¨ Choose nonfat and low-fat healthy foods. Look for the words "nonfat," "low fat," or "fat free." °¨ As a guide, look on the label and choose foods with less than 3 g of fat per serving. Eat only one serving. °· Avoid alcohol. °· Do not smoke. If you need help quitting, talk with your health care provider. °· Eat small frequent meals instead of three large heavy meals. °WHAT FOODS CAN I EAT? °Grains °Include healthy grains and starches such as potatoes, wheat bread, fiber-rich cereal, and brown rice. Choose whole grain options whenever possible. In adults, whole grains should account for 45-65% of your daily calories.  °Fruits and Vegetables °Eat plenty of fruits and vegetables. Fresh fruits and vegetables add fiber to your diet. °Meats and Other Protein Sources °Eat lean meat such as chicken and pork. Trim any fat off of meat before cooking it. Eggs, fish, and beans are other sources of protein. In adults, these foods should account for 10-35% of your daily calories. °Dairy °Choose low-fat milk and dairy options. Dairy includes fat and protein, as well as calcium.  °Fats and Oils °Limit high-fat foods such as fried foods, sweets, baked goods, sugary drinks.  °Other °Creamy sauces and condiments, such as mayonnaise, can add extra fat. Think about whether or not you need to use them, or use smaller amounts or low fat  options. °WHAT FOODS ARE NOT RECOMMENDED? °· High fat foods, such as: °¨ Baked goods. °¨ Ice cream. °¨ French toast. °¨ Sweet rolls. °¨ Pizza. °¨ Cheese bread. °¨ Foods covered with batter, butter, creamy sauces, or cheese. °¨ Fried foods. °¨ Sugary drinks and desserts. °· Foods that cause gas or bloating °  °This information is not intended to replace advice given to you by your health care provider. Make sure you discuss any questions you have with your health care provider. °  °Document Released: 05/10/2013 Document Reviewed: 05/10/2013 °Elsevier Interactive Patient Education ©2016 [employer] ° °

## 2015-07-04 ENCOUNTER — Ambulatory Visit
Admission: RE | Admit: 2015-07-04 | Discharge: 2015-07-04 | Disposition: A | Discharge status: Home / Self Care | Payer: 59 | Source: Ambulatory Visit | Attending: Family Medicine | Admitting: Family Medicine

## 2015-07-04 ENCOUNTER — Other Ambulatory Visit: Payer: Self-pay | Admitting: Family Medicine

## 2015-07-04 DIAGNOSIS — K760 Fatty (change of) liver, not elsewhere classified: Secondary | ICD-10-CM | POA: Diagnosis not present

## 2015-07-04 DIAGNOSIS — R1011 Right upper quadrant pain: Secondary | ICD-10-CM | POA: Diagnosis present

## 2015-09-11 ENCOUNTER — Ambulatory Visit: Payer: Self-pay | Admitting: Family Medicine

## 2015-10-02 ENCOUNTER — Other Ambulatory Visit: Payer: Self-pay | Admitting: Certified Nurse Midwife

## 2015-10-02 DIAGNOSIS — Z1231 Encounter for screening mammogram for malignant neoplasm of breast: Secondary | ICD-10-CM

## 2015-10-22 LAB — HM PAP SMEAR

## 2016-08-14 ENCOUNTER — Emergency Department: Payer: 59

## 2016-08-14 ENCOUNTER — Encounter: Payer: Self-pay | Admitting: *Deleted

## 2016-08-14 ENCOUNTER — Emergency Department
Admission: EM | Admit: 2016-08-14 | Discharge: 2016-08-14 | Disposition: A | Discharge status: Home / Self Care | Payer: 59 | Attending: Emergency Medicine | Admitting: Emergency Medicine

## 2016-08-14 DIAGNOSIS — R1012 Left upper quadrant pain: Secondary | ICD-10-CM | POA: Diagnosis present

## 2016-08-14 DIAGNOSIS — I10 Essential (primary) hypertension: Secondary | ICD-10-CM | POA: Diagnosis not present

## 2016-08-14 DIAGNOSIS — R0789 Other chest pain: Secondary | ICD-10-CM | POA: Insufficient documentation

## 2016-08-14 DIAGNOSIS — Z85038 Personal history of other malignant neoplasm of large intestine: Secondary | ICD-10-CM | POA: Insufficient documentation

## 2016-08-14 DIAGNOSIS — F17210 Nicotine dependence, cigarettes, uncomplicated: Secondary | ICD-10-CM | POA: Diagnosis not present

## 2016-08-14 LAB — URINALYSIS, COMPLETE (UACMP) WITH MICROSCOPIC
BILIRUBIN URINE: NEGATIVE
Bacteria, UA: NONE SEEN
GLUCOSE, UA: NEGATIVE mg/dL
HGB URINE DIPSTICK: NEGATIVE
Ketones, ur: NEGATIVE mg/dL
LEUKOCYTES UA: NEGATIVE
NITRITE: NEGATIVE
PROTEIN: NEGATIVE mg/dL
Specific Gravity, Urine: 1.035 — ABNORMAL HIGH (ref 1.005–1.030)
pH: 7 (ref 5.0–8.0)

## 2016-08-14 LAB — BASIC METABOLIC PANEL
ANION GAP: 7 (ref 5–15)
BUN: 11 mg/dL (ref 6–20)
CO2: 26 mmol/L (ref 22–32)
Calcium: 9 mg/dL (ref 8.9–10.3)
Chloride: 105 mmol/L (ref 101–111)
Creatinine, Ser: 0.68 mg/dL (ref 0.44–1.00)
Glucose, Bld: 116 mg/dL — ABNORMAL HIGH (ref 65–99)
Potassium: 3.8 mmol/L (ref 3.5–5.1)
SODIUM: 138 mmol/L (ref 135–145)

## 2016-08-14 LAB — CBC
HCT: 41.6 % (ref 35.0–47.0)
HEMOGLOBIN: 14.6 g/dL (ref 12.0–16.0)
MCH: 29.2 pg (ref 26.0–34.0)
MCHC: 35.1 g/dL (ref 32.0–36.0)
MCV: 83.2 fL (ref 80.0–100.0)
Platelets: 214 10*3/uL (ref 150–440)
RBC: 5 MIL/uL (ref 3.80–5.20)
RDW: 14.1 % (ref 11.5–14.5)
WBC: 10.8 10*3/uL (ref 3.6–11.0)

## 2016-08-14 LAB — TROPONIN I

## 2016-08-14 MED ORDER — IOPAMIDOL (ISOVUE-300) INJECTION 61%
100.0000 mL | Freq: Once | INTRAVENOUS | Status: AC | PRN
  Administered 2016-08-14: 100 mL via INTRAVENOUS

## 2016-08-14 MED ORDER — IBUPROFEN 800 MG PO TABS: 800.0000 mg | ORAL_TABLET | Freq: Three times a day (TID) | ORAL | 0 refills | Status: DC | PRN

## 2016-08-14 NOTE — ED Provider Notes (Signed)
[facility] Emergency Department Provider Note   First MD Initiated Contact with Patient 08/14/16 [phone]     (approximate)  I have reviewed the triage vital signs and the nursing notes.   HISTORY  Chief Complaint Chest Pain    HPI Brittany Castro is a 56 y.o. female with history of hypertension presents to the emergency department with left upper quadrant/left flank pain intermittently times few months with worsening tonight. Patient denies any pain at present however states at maximum intensity pain is 8 out of 10. Patient denies any other symptoms no nausea vomiting diarrhea constipation or fever.   Past Medical History:  Diagnosis Date  . Hypertension     Patient Active Problem List   Diagnosis Date Noted  . Right upper quadrant abdominal pain 07/03/2015  . Cold sore 05/22/2015  . Obesity, Class II, BMI 35-39.9 03/13/2015  . Special screening for malignant neoplasms, colon   . Benign neoplasm of ascending colon   . Benign neoplasm of transverse colon   . Benign neoplasm of sigmoid colon   . Rectal polyp     Past Surgical History:  Procedure Laterality Date  . carpel tunnel    . COLONOSCOPY WITH PROPOFOL N/A 11/16/2014   Procedure: COLONOSCOPY WITH PROPOFOL;  Surgeon: Brittany Lame, MD;  Location: [facility] ENDOSCOPY;  Service: Endoscopy;  Laterality: N/A;  . TENDON RELEASE      Prior to Admission medications   Not on File    Allergies Patient has no known allergies.  No family history on file.  Social History Social History  Substance Use Topics  . Smoking status: Current Every Day Smoker    Packs/day: 0.25    Years: 20.00    Types: Cigarettes  . Smokeless tobacco: Never Used  . Alcohol use No    Review of Systems Constitutional: No fever/chills Eyes: No visual changes. ENT: No sore throat. Cardiovascular: Denies chest pain. Respiratory: Denies shortness of breath. Gastrointestinal: No left upper quadrant/left lower quadrant  palpation..  No nausea, no vomiting.  No diarrhea.  No constipation. Genitourinary: Negative for dysuria. Musculoskeletal: Negative for back pain. Skin: Negative for rash. Neurological: Negative for headaches, focal weakness or numbness.  10-point ROS otherwise negative.  ____________________________________________   PHYSICAL EXAM:  VITAL SIGNS: ED Triage Vitals  Enc Vitals Group     BP 08/14/16 0211 (!) 166/114     Pulse Rate 08/14/16 0211 95     Resp 08/14/16 0211 20     Temp 08/14/16 0211 98.6 F (37 C)     Temp Source 08/14/16 0211 Oral     SpO2 08/14/16 0211 100 %     Weight 08/14/16 0209 215 lb (97.5 kg)     Height 08/14/16 0209 5\' 3"  (1.6 m)     Head Circumference --      Peak Flow --      Pain Score 08/14/16 0209 8     Pain Loc --      Pain Edu? --      Excl. in [place]? --     Constitutional: Alert and oriented. Well appearing and in no acute distress. Eyes: Conjunctivae are normal. PERRL. EOMI. Head: Atraumatic. Mouth/Throat: Mucous membranes are moist.  Oropharynx non-erythematous. Neck: No stridor.   Cardiovascular: Normal rate, regular rhythm. Good peripheral circulation. Grossly normal heart sounds. Respiratory: Normal respiratory effort.  No retractions. Lungs CTAB. Gastrointestinal:Left upper quadrant/left lower quadrant pain with palpation.. No distention.  Musculoskeletal: No lower extremity tenderness nor edema. No  gross deformities of extremities. Neurologic:  Normal speech and language. No gross focal neurologic deficits are appreciated.  Skin:  Skin is warm, dry and intact. No rash noted. Psychiatric: Mood and affect are normal. Speech and behavior are normal.  ____________________________________________   LABS (all labs ordered are listed, but only abnormal results are displayed)  Labs Reviewed  BASIC METABOLIC PANEL - Abnormal; Notable for the following:       Result Value   Glucose, Bld 116 (*)    All other components within normal limits    CBC  TROPONIN I  URINALYSIS, COMPLETE (UACMP) WITH MICROSCOPIC   ____________________________________________  EKG  ED ECG REPORT I, [place] Rex[place] Prevo, the attending physician, personally viewed and interpreted this ECG.   Date: 08/14/2016  EKG Time: 2:12 AM  Rate: 93  Rhythm: Normal sinus rhythm  Axis: Normal  Intervals: Normal  ST&T Change: None  ____________________________________________  RADIOLOGY I, [place] Takita Riecke, personally viewed and evaluated these images (plain radiographs) as part of my medical decision making, as well as reviewing the written report by the radiologist.  Dg Chest 2 View  Result Date: 08/14/2016 CLINICAL DATA:  56 y/o  F; left chest pain and left mid back pain. EXAM: CHEST  2 VIEW COMPARISON:  05/22/2015 chest radiograph. FINDINGS: Diminished streaky opacity in the right mid lung zone probably represents scarring. Stable normal cardiac silhouette. No focal consolidation. No pleural effusion or pneumothorax. The no acute osseous abnormality is identified. IMPRESSION: No active cardiopulmonary disease. Electronically Signed   By: Brittany Castro M.D.   On: 08/14/2016 02:52      Procedures   ____________________________________________   INITIAL IMPRESSION / ASSESSMENT AND PLAN / ED COURSE  Pertinent labs & imaging results that were available during my care of the patient were reviewed by me and considered in my medical decision making (see chart for details).  56 year old female presenting with left upper quadrant/flank pain times few months. CT scan pending along with laboratory data. Patient's care transferred to Dr. Mariea Castro      ____________________________________________  FINAL CLINICAL IMPRESSION(S) / ED DIAGNOSES  Final diagnoses:  Left upper quadrant pain  Left-sided chest wall pain     MEDICATIONS GIVEN DURING THIS VISIT:  Medications - No data to display   NEW OUTPATIENT MEDICATIONS STARTED DURING THIS  VISIT:  New Prescriptions   No medications on file    Modified Medications   No medications on file    Discontinued Medications   No medications on file     Note:  This document was prepared using Dragon voice recognition software and may include unintentional dictation errors.    Gregor Hams, MD 08/15/16 [phone]

## 2016-08-14 NOTE — Discharge Instructions (Signed)
You may alternate Tylenol and Motrin for your pain.  Return to the emergency department if you develop severe pain, fever, vomiting, or any other symptoms concerning to you.

## 2016-08-14 NOTE — ED Triage Notes (Addendum)
Pt to triage via wheelchair . Pt has pain in left chest and left mid back area.  Pt reports sharp pain beneath left breast while at work tonight.  Intermittent sob.  No n/v/d.  cig smoker.  Pt alert.    bp elevated, no meds for several years.

## 2016-08-14 NOTE — ED Notes (Signed)
Report given to Bill RN

## 2016-08-14 NOTE — ED Notes (Signed)
Patient transported to CT 

## 2016-08-14 NOTE — ED Notes (Signed)
Pt c/o L rib cage pain. Pt sts that she has had same in past, sts that it will "switch" between L side and R side. Pt sts that episode of pain that began 3/29 was worse than normal.  Pt reports incr pain w/ movement, pain reproducible w/ palpation

## 2016-08-19 ENCOUNTER — Encounter: Payer: Self-pay | Admitting: Family Medicine

## 2016-08-19 ENCOUNTER — Ambulatory Visit (INDEPENDENT_AMBULATORY_CARE_PROVIDER_SITE_OTHER): Payer: 59 | Admitting: Family Medicine

## 2016-08-19 VITALS — BP 158/86 | HR 99 | Temp 97.9°F | Resp 16 | Wt 223.2 lb

## 2016-08-19 DIAGNOSIS — R1010 Upper abdominal pain, unspecified: Secondary | ICD-10-CM | POA: Diagnosis not present

## 2016-08-19 DIAGNOSIS — I10 Essential (primary) hypertension: Secondary | ICD-10-CM | POA: Diagnosis not present

## 2016-08-19 DIAGNOSIS — Z720 Tobacco use: Secondary | ICD-10-CM

## 2016-08-19 DIAGNOSIS — K760 Fatty (change of) liver, not elsewhere classified: Secondary | ICD-10-CM

## 2016-08-19 DIAGNOSIS — R1084 Generalized abdominal pain: Secondary | ICD-10-CM | POA: Insufficient documentation

## 2016-08-19 HISTORY — DX: Essential (primary) hypertension: I10

## 2016-08-19 HISTORY — DX: Tobacco use: Z72.0

## 2016-08-19 HISTORY — DX: Fatty (change of) liver, not elsewhere classified: K76.0

## 2016-08-19 MED ORDER — POLYETHYLENE GLYCOL 3350 17 GM/SCOOP PO POWD: 17.0000 g | Freq: Every day | ORAL | 1 refills | Status: DC

## 2016-08-19 MED ORDER — HYDROCHLOROTHIAZIDE 12.5 MG PO CAPS: 12.5000 mg | ORAL_CAPSULE | Freq: Every day | ORAL | 0 refills | Status: DC

## 2016-08-19 NOTE — Assessment & Plan Note (Signed)
Reviewed CT scan; moderate to severe fatty liver; redundant colon; may be IBS, may be constipation; check labs, start Miralax

## 2016-08-19 NOTE — Patient Instructions (Addendum)
Try to drink 8 glasses of water a day (64 ounces) Start the HCTZ Your goal blood pressure is less than 140 mmHg on top. Try to follow the DASH guidelines (DASH stands for Dietary Approaches to Stop Hypertension) Try to limit the sodium in your diet.  Ideally, consume less than 1.5 grams (less than 1,500mg ) per day. Do not add salt when cooking or at the table.  Check the sodium amount on labels when shopping, and choose items lower in sodium when given a choice. Avoid or limit foods that already contain a lot of sodium. Eat a diet rich in fruits and vegetables and whole grains. Start Miralax Return in 2 weeks to see me   DASH Eating Plan DASH stands for "Dietary Approaches to Stop Hypertension." The DASH eating plan is a healthy eating plan that has been shown to reduce high blood pressure (hypertension). It may also reduce your risk for type 2 diabetes, heart disease, and stroke. The DASH eating plan may also help with weight loss. What are tips for following this plan? General guidelines   Avoid eating more than 2,300 mg (milligrams) of salt (sodium) a day. If you have hypertension, you may need to reduce your sodium intake to 1,500 mg a day.  Limit alcohol intake to no more than 1 drink a day for nonpregnant women and 2 drinks a day for men. One drink equals 12 oz of beer, 5 oz of wine, or 1 oz of hard liquor.  Work with your health care provider to maintain a healthy body weight or to lose weight. Ask what an ideal weight is for you.  Get at least 30 minutes of exercise that causes your heart to beat faster (aerobic exercise) most days of the week. Activities may include walking, swimming, or biking.  Work with your health care provider or diet and nutrition specialist (dietitian) to adjust your eating plan to your individual calorie needs. Reading food labels   Check food labels for the amount of sodium per serving. Choose foods with less than 5 percent of the Daily Value of sodium.  Generally, foods with less than 300 mg of sodium per serving fit into this eating plan.  To find whole grains, look for the word "whole" as the first word in the ingredient list. Shopping   Buy products labeled as "low-sodium" or "no salt added."  Buy fresh foods. Avoid canned foods and premade or frozen meals. Cooking   Avoid adding salt when cooking. Use salt-free seasonings or herbs instead of table salt or sea salt. Check with your health care provider or pharmacist before using salt substitutes.  Do not fry foods. Cook foods using healthy methods such as baking, boiling, grilling, and broiling instead.  Cook with heart-healthy oils, such as olive, canola, soybean, or sunflower oil. Meal planning    Eat a balanced diet that includes:  5 or more servings of fruits and vegetables each day. At each meal, try to fill half of your plate with fruits and vegetables.  Up to 6-8 servings of whole grains each day.  Less than 6 oz of lean meat, poultry, or fish each day. A 3-oz serving of meat is about the same size as a deck of cards. One egg equals 1 oz.  2 servings of low-fat dairy each day.  A serving of nuts, seeds, or beans 5 times each week.  Heart-healthy fats. Healthy fats called Omega-3 fatty acids are found in foods such as flaxseeds and coldwater fish, like  sardines, salmon, and mackerel.  Limit how much you eat of the following:  Canned or prepackaged foods.  Food that is high in trans fat, such as fried foods.  Food that is high in saturated fat, such as fatty meat.  Sweets, desserts, sugary drinks, and other foods with added sugar.  Full-fat dairy products.  Do not salt foods before eating.  Try to eat at least 2 vegetarian meals each week.  Eat more home-cooked food and less restaurant, buffet, and fast food.  When eating at a restaurant, ask that your food be prepared with less salt or no salt, if possible. What foods are recommended? The items listed may  not be a complete list. Talk with your dietitian about what dietary choices are best for you. Grains  Whole-grain or whole-wheat bread. Whole-grain or whole-wheat pasta. Brown rice. [place]. Bulgur. Whole-grain and low-sodium cereals. Pita bread. Low-fat, low-sodium crackers. Whole-wheat flour tortillas. Vegetables  Fresh or frozen vegetables (raw, steamed, roasted, or grilled). Low-sodium or reduced-sodium tomato and vegetable juice. Low-sodium or reduced-sodium tomato sauce and tomato paste. Low-sodium or reduced-sodium canned vegetables. Fruits  All fresh, dried, or frozen fruit. Canned fruit in natural juice (without added sugar). Meat and other protein foods  Skinless chicken or [place]. Ground chicken or [place]. Pork with fat trimmed off. Fish and seafood. Egg whites. Dried beans, peas, or lentils. Unsalted nuts, nut butters, and seeds. Unsalted canned beans. Lean cuts of beef with fat trimmed off. Low-sodium, lean deli meat. Dairy  Low-fat (1%) or fat-free (skim) milk. Fat-free, low-fat, or reduced-fat cheeses. Nonfat, low-sodium ricotta or cottage cheese. Low-fat or nonfat yogurt. Low-fat, low-sodium cheese. Fats and oils  Soft margarine without trans fats. Vegetable oil. Low-fat, reduced-fat, or light mayonnaise and salad dressings (reduced-sodium). Canola, safflower, olive, soybean, and sunflower oils. Avocado. Seasoning and other foods  Herbs. Spices. Seasoning mixes without salt. Unsalted popcorn and pretzels. Fat-free sweets. What foods are not recommended? The items listed may not be a complete list. Talk with your dietitian about what dietary choices are best for you. Grains  Baked goods made with fat, such as croissants, muffins, or some breads. Dry pasta or rice meal packs. Vegetables  Creamed or fried vegetables. Vegetables in a cheese sauce. Regular canned vegetables (not low-sodium or reduced-sodium). Regular canned tomato sauce and paste (not low-sodium or  reduced-sodium). Regular tomato and vegetable juice (not low-sodium or reduced-sodium). Angie Fava. Olives. Fruits  Canned fruit in a light or heavy syrup. Fried fruit. Fruit in cream or butter sauce. Meat and other protein foods  Fatty cuts of meat. Ribs. Fried meat. Berniece Salines. Sausage. Bologna and other processed lunch meats. Salami. Fatback. Hotdogs. Bratwurst. Salted nuts and seeds. Canned beans with added salt. Canned or smoked fish. Whole eggs or egg yolks. Chicken or [place] with skin. Dairy  Whole or 2% milk, cream, and half-and-half. Whole or full-fat cream cheese. Whole-fat or sweetened yogurt. Full-fat cheese. Nondairy creamers. Whipped toppings. Processed cheese and cheese spreads. Fats and oils  Butter. Stick margarine. Lard. Shortening. Ghee. Bacon fat. Tropical oils, such as coconut, palm kernel, or palm oil. Seasoning and other foods  Salted popcorn and pretzels. Onion salt, garlic salt, seasoned salt, table salt, and sea salt. Worcestershire sauce. Tartar sauce. Barbecue sauce. Teriyaki sauce. Soy sauce, including reduced-sodium. Steak sauce. Canned and packaged gravies. Fish sauce. Oyster sauce. Cocktail sauce. Horseradish that you find on the shelf. Ketchup. Mustard. Meat flavorings and tenderizers. Bouillon cubes. Hot sauce and Tabasco sauce. Premade or packaged marinades. Premade  or packaged taco seasonings. Relishes. Regular salad dressings. Where to find more information:  National Heart, Lung, and [place]: https://wilson-eaton.com/  American Heart Association: www.heart.org Summary  The DASH eating plan is a healthy eating plan that has been shown to reduce high blood pressure (hypertension). It may also reduce your risk for type 2 diabetes, heart disease, and stroke.  With the DASH eating plan, you should limit salt (sodium) intake to 2,300 mg a day. If you have hypertension, you may need to reduce your sodium intake to 1,500 mg a day.  When on the DASH eating plan, aim to eat  more fresh fruits and vegetables, whole grains, lean proteins, low-fat dairy, and heart-healthy fats.  Work with your health care provider or diet and nutrition specialist (dietitian) to adjust your eating plan to your individual calorie needs. This information is not intended to replace advice given to you by your health care provider. Make sure you discuss any questions you have with your health care provider. Document Released: 04/24/2011 Document Revised: 04/28/2016 Document Reviewed: 04/28/2016 Elsevier Interactive Patient Education  2017 [employer].

## 2016-08-19 NOTE — Progress Notes (Signed)
BP (!) 158/86   Pulse 99   Temp 97.9 F (36.6 C) (Oral)   Resp 16   Wt 223 lb 3.2 oz (101.2 kg)   SpO2 93%   BMI 39.54 kg/m    Subjective:    Patient ID: Brittany Castro, female    DOB: 06-Jan-1961, 55 y.o.   MRN: [id number]  HPI: Brittany Castro is a 56 y.o. female  Chief Complaint  Patient presents with  . Abdominal Pain    over a year has been seen multiple times and has had multiple scans all normal.    Patient is here as a new patient to me Her previous provider left the office, she has not been seen here as a patient since Feb 2017 She went to the ER on 08/14/2016 with LUQ pain; note, CT scan, labs reviewed  She went to see Dr. Candace Cruise (GI at [facility]), note from 07/17/2015 She had a colonoscopy with Dr. Allen Norris on 11/16/2014; next due in 5 years  She was at work; pains just hit her; hurts so bad, she thought she was having a heart attack; group leader took her to the hospital; had labs, xrays, CT scan, everything, EKG and they couldn't find out what's causing these pains These pains can be on the right side  She had redundant transverse and sigmoid colon; sometimes hard to go and has to strain; goes once a day; stool itself is real mushy, not a lot; no blood; no mucous  CT scan: Stomach/Bowel: Negative rectum. Moderately redundant sigmoid colon. Negative sigmoid and left colon aside from some retained stool. Mildly redundant transverse colon with retained stool. Negative right colon and appendix. Negative terminal ileum. No dilated small bowel. Decompressed stomach. Small sliding-type gastric hiatal hernia. Negative duodenum.  Urine showed dehydration; might drink 3-4 glasses of water a day Drinks some alcohol, once every blue moon  Depression screen [facility] 2/9 08/19/2016 05/22/2015 03/13/2015  Decreased Interest 0 0 0  Down, Depressed, Hopeless 0 0 0  PHQ - 2 Score 0 0 0   Relevant past medical, surgical, family and social history reviewed Past Medical History:    Diagnosis Date  . Benign hypertension 08/19/2016  . Fatty liver 08/19/2016   Moderate to severe on CT scan March 2018  . Hypertension   . Tobacco abuse 08/19/2016   Past Surgical History:  Procedure Laterality Date  . carpel tunnel    . COLONOSCOPY WITH PROPOFOL N/A 11/16/2014   Procedure: COLONOSCOPY WITH PROPOFOL;  Surgeon: Lucilla Lame, MD;  Location: [facility] ENDOSCOPY;  Service: Endoscopy;  Laterality: N/A;  . TENDON RELEASE     Family History  Problem Relation Age of Onset  . Multiple sclerosis Mother   . Clotting disorder Mother   . Hyperlipidemia Mother   . Stroke Mother   . Alcohol abuse Father   . Cirrhosis Father   . Alcohol abuse Brother   . Cirrhosis Brother   . Heart attack Maternal Grandmother   . Diabetes Paternal Grandmother    Social History  Substance Use Topics  . Smoking status: Current Every Day Smoker    Packs/day: 0.25    Years: 20.00    Types: Cigarettes  . Smokeless tobacco: Never Used  . Alcohol use No   Interim medical history since last visit reviewed. Allergies and medications reviewed  Review of Systems Per HPI unless specifically indicated above     Objective:    BP (!) 158/86   Pulse 99   Temp  97.9 F (36.6 C) (Oral)   Resp 16   Wt 223 lb 3.2 oz (101.2 kg)   SpO2 93%   BMI 39.54 kg/m   Wt Readings from Last 3 Encounters:  08/19/16 223 lb 3.2 oz (101.2 kg)  08/14/16 215 lb (97.5 kg)  07/03/15 211 lb 14.4 oz (96.1 kg)    Physical Exam  Constitutional: She appears well-developed and well-nourished. No distress.  HENT:  Head: Normocephalic and atraumatic.  Eyes: EOM are normal. No scleral icterus.  Neck: No JVD present. No thyromegaly present.  Cardiovascular: Normal rate, regular rhythm and normal heart sounds.   No murmur heard. Pulmonary/Chest: Effort normal and breath sounds normal. No respiratory distress. She has no wheezes.  Abdominal: Soft. Bowel sounds are normal. She exhibits no distension. There is no tenderness. There  is no guarding.  Soft lipomatous lesion RUQ very superficial, nontender  Musculoskeletal: Normal range of motion. She exhibits no edema (no pitting edema of the hands or legs or feet).  Neurological: She is alert. She exhibits normal muscle tone.  Skin: Skin is warm and dry. She is not diaphoretic. No pallor.  Psychiatric: She has a normal mood and affect. Her behavior is normal. Judgment and thought content normal.    Results for orders placed or performed during the hospital encounter of 37/85/88  Basic metabolic panel  Result Value Ref Range   Sodium 138 135 - 145 mmol/L   Potassium 3.8 3.5 - 5.1 mmol/L   Chloride 105 101 - 111 mmol/L   CO2 26 22 - 32 mmol/L   Glucose, Bld 116 (H) 65 - 99 mg/dL   BUN 11 6 - 20 mg/dL   Creatinine, Ser 0.68 0.44 - 1.00 mg/dL   Calcium 9.0 8.9 - 10.3 mg/dL   GFR calc non Af Amer >60 >60 mL/min   GFR calc Af Amer >60 >60 mL/min   Anion gap 7 5 - 15  CBC  Result Value Ref Range   WBC 10.8 3.6 - 11.0 K/uL   RBC 5.00 3.80 - 5.20 MIL/uL   Hemoglobin 14.6 12.0 - 16.0 g/dL   HCT 41.6 35.0 - 47.0 %   MCV 83.2 80.0 - 100.0 fL   MCH 29.2 26.0 - 34.0 pg   MCHC 35.1 32.0 - 36.0 g/dL   RDW 14.1 11.5 - 14.5 %   Platelets 214 150 - 440 K/uL  Troponin I  Result Value Ref Range   Troponin I <0.03 <0.03 ng/mL  Urinalysis, Complete w Microscopic  Result Value Ref Range   Color, Urine STRAW (A) YELLOW   APPearance CLEAR (A) CLEAR   Specific Gravity, Urine 1.035 (H) 1.005 - 1.030   pH 7.0 5.0 - 8.0   Glucose, UA NEGATIVE NEGATIVE mg/dL   Hgb urine dipstick NEGATIVE NEGATIVE   Bilirubin Urine NEGATIVE NEGATIVE   Ketones, ur NEGATIVE NEGATIVE mg/dL   Protein, ur NEGATIVE NEGATIVE mg/dL   Nitrite NEGATIVE NEGATIVE   Leukocytes, UA NEGATIVE NEGATIVE   RBC / HPF 0-5 0 - 5 RBC/hpf   WBC, UA 0-5 0 - 5 WBC/hpf   Bacteria, UA NONE SEEN NONE SEEN   Squamous Epithelial / LPF 0-5 (A) NONE SEEN      Assessment & Plan:   Problem List Items Addressed This Visit       Cardiovascular and Mediastinum   Benign hypertension    Try DASH guidelines; will start HCTZ since she reports some fluid retention; weight loss; return in 2 weeks for re-evaluation, BP check,  and will get BMP at that time      Relevant Medications   hydrochlorothiazide (MICROZIDE) 12.5 MG capsule     Digestive   Fatty liver    Work on reducing abdominal obesity; check cholesterol; check liver enzymes and other hepatitis labs; not suspicious of alcohol as the culprit      Relevant Orders   Hepatic function panel   Hepatitis C Antibody   Hepatitis B Surface AntiBODY   Lipid panel   Hepatitis B Surface AntiGEN     Other   Tobacco abuse    Will talk with patient at next visit about smoking      Abdominal pain    Reviewed CT scan; moderate to severe fatty liver; redundant colon; may be IBS, may be constipation; check labs, start Miralax      Relevant Orders   Hepatic function panel   Hepatitis C Antibody   Hepatitis B Surface AntiBODY   Lipid panel   Hepatitis B Surface AntiGEN       Follow up plan: Return in about 2 weeks (around 09/02/2016) for abdominal pain.  An after-visit summary was printed and given to the patient at [place].  Please see the patient instructions which may contain other information and recommendations beyond what is mentioned above in the assessment and plan.  Meds ordered this encounter  Medications  . polyethylene glycol powder (GLYCOLAX/MIRALAX) powder    Sig: Take 17 g by mouth daily.    Dispense:  3350 g    Refill:  1  . hydrochlorothiazide (MICROZIDE) 12.5 MG capsule    Sig: Take 1 capsule (12.5 mg total) by mouth daily.    Dispense:  30 capsule    Refill:  0    Orders Placed This Encounter  Procedures  . Hepatic function panel  . Hepatitis C Antibody  . Hepatitis B Surface AntiBODY  . Lipid panel  . Hepatitis B Surface AntiGEN

## 2016-08-19 NOTE — Assessment & Plan Note (Signed)
Try DASH guidelines; will start HCTZ since she reports some fluid retention; weight loss; return in 2 weeks for re-evaluation, BP check, and will get BMP at that time

## 2016-08-19 NOTE — Assessment & Plan Note (Signed)
Will talk with patient at next visit about smoking

## 2016-08-19 NOTE — Assessment & Plan Note (Signed)
Work on reducing abdominal obesity; check cholesterol; check liver enzymes and other hepatitis labs; not suspicious of alcohol as the culprit

## 2016-08-20 LAB — LIPID PANEL
Chol/HDL Ratio: 4.8 ratio — ABNORMAL HIGH (ref 0.0–4.4)
Cholesterol, Total: 181 mg/dL (ref 100–199)
HDL: 38 mg/dL — ABNORMAL LOW (ref >39)
LDL Calculated: 100 mg/dL — ABNORMAL HIGH (ref 0–99)
TRIGLYCERIDES: 215 mg/dL — AB (ref 0–149)
VLDL Cholesterol Cal: 43 mg/dL — ABNORMAL HIGH (ref 5–40)

## 2016-08-20 LAB — HEPATITIS C ANTIBODY

## 2016-08-20 LAB — HEPATIC FUNCTION PANEL
ALT: 42 IU/L — AB (ref 0–32)
AST: 38 IU/L (ref 0–40)
Albumin: 4.4 g/dL (ref 3.5–5.5)
Alkaline Phosphatase: 135 IU/L — ABNORMAL HIGH (ref 39–117)
BILIRUBIN, DIRECT: 0.1 mg/dL (ref 0.00–0.40)
Bilirubin Total: 0.3 mg/dL (ref 0.0–1.2)
TOTAL PROTEIN: 7.1 g/dL (ref 6.0–8.5)

## 2016-08-20 LAB — HEPATITIS B SURFACE ANTIGEN: HEP B S AG: NEGATIVE

## 2016-08-20 LAB — HEPATITIS B SURFACE ANTIBODY,QUALITATIVE: HEP B SURFACE AB, QUAL: REACTIVE

## 2016-08-28 ENCOUNTER — Telehealth: Payer: Self-pay

## 2016-08-28 ENCOUNTER — Other Ambulatory Visit: Payer: Self-pay

## 2016-08-28 DIAGNOSIS — R1010 Upper abdominal pain, unspecified: Secondary | ICD-10-CM

## 2016-08-28 DIAGNOSIS — R112 Nausea with vomiting, unspecified: Secondary | ICD-10-CM

## 2016-08-28 NOTE — Telephone Encounter (Signed)
Patient states she has not use the restroom today. She is scared of what will happen. She states she is fatigue and nausea and she feels she will not make it to work this whole week. She is asking for a letter to be out the rest of the week.

## 2016-08-28 NOTE — Telephone Encounter (Signed)
-----   Message from Arnetha Courser, MD sent at 08/28/2016 10:18 AM EDT ----- Please enter high priority referral to GI doctor of her choice; instruct her to go back to the ER if significant pain; thank you; we certainly hope she starts to feel better soon and has answers

## 2016-08-28 NOTE — Telephone Encounter (Signed)
Please see if she can see me tomorrow To urgent care or ER if worse

## 2016-08-29 ENCOUNTER — Encounter: Payer: Self-pay | Admitting: Family Medicine

## 2016-08-29 ENCOUNTER — Ambulatory Visit (INDEPENDENT_AMBULATORY_CARE_PROVIDER_SITE_OTHER): Payer: 59 | Admitting: Family Medicine

## 2016-08-29 ENCOUNTER — Ambulatory Visit
Admission: RE | Admit: 2016-08-29 | Discharge: 2016-08-29 | Disposition: A | Discharge status: Home / Self Care | Payer: 59 | Source: Ambulatory Visit | Attending: Family Medicine | Admitting: Family Medicine

## 2016-08-29 DIAGNOSIS — R140 Abdominal distension (gaseous): Secondary | ICD-10-CM

## 2016-08-29 DIAGNOSIS — R1010 Upper abdominal pain, unspecified: Secondary | ICD-10-CM

## 2016-08-29 DIAGNOSIS — Q438 Other specified congenital malformations of intestine: Secondary | ICD-10-CM | POA: Insufficient documentation

## 2016-08-29 MED ORDER — PROMETHAZINE HCL 12.5 MG PO TABS: 12.5000 mg | ORAL_TABLET | Freq: Three times a day (TID) | ORAL | 0 refills | Status: DC | PRN

## 2016-08-29 NOTE — Patient Instructions (Addendum)
Hydrate and get calories little by little through the day Have the xrays done across the street when you leave here If you get worse, do go right on to the ER

## 2016-08-29 NOTE — Assessment & Plan Note (Signed)
Consider this as cause of symptoms suggestive of partial bowel obstruction; request surgeon to evauate, listen to her story, and think about partial colectomy as definitive solution to her chronic painful problem

## 2016-08-29 NOTE — Assessment & Plan Note (Addendum)
Refer to surgeon to see if surgical resolution for her chronic abdominal pain; she has already seen GI and I have previous put in GI referral as well; the redundant colon may play a part

## 2016-08-29 NOTE — Telephone Encounter (Signed)
Pt coming in today @2 :20 pm.

## 2016-08-29 NOTE — Assessment & Plan Note (Addendum)
Refer to surg; discussed possibility of partial bowel obstruction; risk of perforation; if worse over the weekend, may benefit from NGT decompression; I do not believe this is her gallbladder

## 2016-08-29 NOTE — Progress Notes (Signed)
BP 122/76   Pulse 99   Temp 98.1 F (36.7 C) (Oral)   Resp 14   Wt 215 lb (97.5 kg)   SpO2 97%   BMI 38.09 kg/m    Subjective:    Patient ID: Brittany Castro, female    DOB: [birth year], 56 y.o.   MRN: [id number]  HPI: Brittany Castro is a 56 y.o. female  Chief Complaint  Patient presents with  . Abdominal Pain    bloated, nausea and vomiting    HPI  Patient is here c/o abdominal pain; going on for a year; has seen specialists, had lots of tests done She is frustrated because doctors seem to think it's her gallbladder but they've checked and it's not her gallbladder; she denies having any RUQ pain after eating; her pain is across the abdomen, starting lower on the right, then goes across She was in such bad pain a few days ago that she went to the ER at [facility]; her abdomen was so distended and tight that she thought it was going to burst They did an [place] but no AAS or CT scan; they also did labs; we reviewed these results today together She did NOT have any relief of her symptoms with the GI cocktail; documentation from [facility] reviewed and patient says that is not true She is feeling like things are not moving, threw up all of her cereal Afraid to eat Very bloated Right now, 50% better now compared to 2 days ago when she was in the ER  Depression screen [facility] 2/9 08/29/2016 08/19/2016 05/22/2015 03/13/2015  Decreased Interest 0 0 0 0  Down, Depressed, Hopeless 0 0 0 0  PHQ - 2 Score 0 0 0 0   Relevant past medical, surgical, family and social history reviewed Past Medical History:  Diagnosis Date  . Benign hypertension 08/19/2016  . Fatty liver 08/19/2016   Moderate to severe on CT scan March 2018  . Hypertension   . Tobacco abuse 08/19/2016   Past Surgical History:  Procedure Laterality Date  . carpel tunnel    . COLONOSCOPY WITH PROPOFOL N/A 11/16/2014   Procedure: COLONOSCOPY WITH PROPOFOL;  Surgeon: Lucilla Lame, MD;  Location: [facility] ENDOSCOPY;  Service: Endoscopy;  Laterality: N/A;  .  TENDON RELEASE     Family History  Problem Relation Age of Onset  . Multiple sclerosis Mother   . Clotting disorder Mother   . Hyperlipidemia Mother   . Stroke Mother   . Alcohol abuse Father   . Cirrhosis Father   . Alcohol abuse Brother   . Cirrhosis Brother   . Heart attack Maternal Grandmother   . Diabetes Paternal Grandmother    Social History  Substance Use Topics  . Smoking status: Current Every Day Smoker    Packs/day: 0.25    Years: 20.00    Types: Cigarettes  . Smokeless tobacco: Never Used  . Alcohol use No   Interim medical history since last visit reviewed. Allergies and medications reviewed  Review of Systems Per HPI unless specifically indicated above     Objective:    BP 122/76   Pulse 99   Temp 98.1 F (36.7 C) (Oral)   Resp 14   Wt 215 lb (97.5 kg)   SpO2 97%   BMI 38.09 kg/m   Wt Readings from Last 3 Encounters:  08/29/16 215 lb (97.5 kg)  08/19/16 223 lb 3.2 oz (101.2 kg)  08/14/16 215 lb (97.5 kg)    Physical Exam  Constitutional: She appears well-developed and well-nourished.  HENT:  Mouth/Throat: Mucous membranes are normal.  Eyes: EOM are normal. No scleral icterus.  Cardiovascular: Normal rate and regular rhythm.   Pulmonary/Chest: Effort normal and breath sounds normal.  Abdominal: Soft. Bowel sounds are normal. She exhibits distension. She exhibits no mass. There is no tenderness. There is no guarding.  Skin: She is not diaphoretic. No pallor.  Psychiatric: She has a normal mood and affect. Her behavior is normal. Her mood appears not anxious. She does not exhibit a depressed mood.   Results for orders placed or performed in visit on 08/19/16  Hepatic function panel  Result Value Ref Range   Total Protein 7.1 6.0 - 8.5 g/dL   Albumin 4.4 3.5 - 5.5 g/dL   Bilirubin Total 0.3 0.0 - 1.2 mg/dL   Bilirubin, Direct 0.10 0.00 - 0.40 mg/dL   Alkaline Phosphatase 135 (H) 39 - 117 IU/L   AST 38 0 - 40 IU/L   ALT 42 (H) 0 - 32 IU/L    Hepatitis C Antibody  Result Value Ref Range   Hep C Virus Ab <0.1 0.0 - 0.9 s/co ratio  Hepatitis B Surface AntiBODY  Result Value Ref Range   Hep B Surface Ab, Qual Reactive   Lipid panel  Result Value Ref Range   Cholesterol, Total 181 100 - 199 mg/dL   Triglycerides 215 (H) 0 - 149 mg/dL   HDL 38 (L) >39 mg/dL   VLDL Cholesterol Cal 43 (H) 5 - 40 mg/dL   LDL Calculated 100 (H) 0 - 99 mg/dL   Chol/HDL Ratio 4.8 (H) 0.0 - 4.4 ratio  Hepatitis B Surface AntiGEN  Result Value Ref Range   Hepatitis B Surface Ag Negative Negative      Assessment & Plan:   Problem List Items Addressed This Visit      Digestive   Redundant colon    Consider this as cause of symptoms suggestive of partial bowel obstruction; request surgeon to evauate, listen to her story, and think about partial colectomy as definitive solution to her chronic painful problem      Relevant Orders   Ambulatory referral to General Surgery   DG Abd Acute W/Chest (Completed)     Other   Abdominal pain    Refer to surgeon to see if surgical resolution for her chronic abdominal pain; she has already seen GI and I have previous put in GI referral as well; the redundant colon may play a part      Relevant Orders   Ambulatory referral to General Surgery   DG Abd Acute W/Chest (Completed)   Abdominal bloating    Refer to surg; discussed possibility of partial bowel obstruction; risk of perforation; if worse over the weekend, may benefit from NGT decompression; I do not believe this is her gallbladder      Relevant Orders   Ambulatory referral to General Surgery   DG Abd Acute W/Chest (Completed)       Follow up plan: No Follow-up on file.  An after-visit summary was printed and given to the patient at [facility].  Please see the patient instructions which may contain other information and recommendations beyond what is mentioned above in the assessment and plan.  Meds ordered this encounter  Medications  .  promethazine (PHENERGAN) 12.5 MG tablet    Sig: Take 1 tablet (12.5 mg total) by mouth every 8 (eight) hours as needed for nausea or vomiting.    Dispense:  12  tablet    Refill:  0    Orders Placed This Encounter  Procedures  . DG Abd Acute W/Chest  . Ambulatory referral to General Surgery

## 2016-08-30 ENCOUNTER — Telehealth: Payer: Self-pay | Admitting: Family Medicine

## 2016-08-30 NOTE — Telephone Encounter (Signed)
I looked for xray report this morning and again this afternoon Still no result I called patient Explained situation; asked how she was doing; no vomiting; spreading out her POs, tolerating those okay Go to ER if worse over weekend She agrees

## 2016-08-30 NOTE — Telephone Encounter (Signed)
I checked for the xray results; nothing reported yet

## 2016-09-01 ENCOUNTER — Encounter: Payer: Self-pay | Admitting: *Deleted

## 2016-09-01 ENCOUNTER — Ambulatory Visit: Payer: 59 | Admitting: Family Medicine

## 2016-09-02 ENCOUNTER — Encounter: Payer: Self-pay | Admitting: *Deleted

## 2016-09-08 ENCOUNTER — Encounter: Payer: Self-pay | Admitting: General Surgery

## 2016-09-08 ENCOUNTER — Ambulatory Visit (INDEPENDENT_AMBULATORY_CARE_PROVIDER_SITE_OTHER): Payer: 59 | Admitting: General Surgery

## 2016-09-08 VITALS — BP 130/80 | HR 72 | Resp 14 | Ht 63.0 in | Wt 221.0 lb

## 2016-09-08 DIAGNOSIS — R1084 Generalized abdominal pain: Secondary | ICD-10-CM | POA: Diagnosis not present

## 2016-09-08 LAB — POC HEMOCCULT BLD/STL (OFFICE/1-CARD/DIAGNOSTIC)

## 2016-09-08 NOTE — Patient Instructions (Addendum)
Follow up as needed. Restart Miralax. Use 1/2 cap full daily.   Follow up with Dr Allen Norris on 08/31/16.

## 2016-09-08 NOTE — Progress Notes (Signed)
Patient ID: Brittany Castro, female   DOB: 10-16-1960, 56 y.o.   MRN: [id number]  Chief Complaint  Patient presents with  . Abdominal Pain    HPI Brittany Castro is a 56 y.o. female she is her for evaluation of problems with her colon. She has been having belly pains for the past 6 months. She reports that on 08/14/16 she had problems with pain in her sides and stomach that were very bad. She was seen at the hospital for this and assessed and had a CT scan done. She was seen by Dr Sanda Klein on 08/19/16 and stated on Miralax. On 08/27/16 she started throwing up with severe abdominal pain and bloating. She was seen at [facility] for this and given fluids and had blood work done but no imaging. She had severe vomiting on 08/28/16. She stopped eating for two days after this. She follow up with Dr Sanda Klein on 08/29/16 and had abdominal x-rays done. He last colonoscopy was 10/2014.  Previous to all this she had daily bowel movements without any problems.  HPI  Past Medical History:  Diagnosis Date  . Benign hypertension 08/19/2016  . Fatty liver 08/19/2016   Moderate to severe on CT scan March 2018  . Hypertension   . Thyroid disease   . Tobacco abuse 08/19/2016    Past Surgical History:  Procedure Laterality Date  . carpel tunnel    . COLONOSCOPY WITH PROPOFOL N/A 11/16/2014   Procedure: COLONOSCOPY WITH PROPOFOL;  Surgeon: Lucilla Lame, MD;  Location: [facility] ENDOSCOPY;  Service: Endoscopy;  Laterality: N/A;  . HERNIA REPAIR  2002   Dr Bary Castilla  . TENDON RELEASE    . TUBAL LIGATION  2002    Family History  Problem Relation Age of Onset  . Multiple sclerosis Mother   . Clotting disorder Mother   . Hyperlipidemia Mother   . Stroke Mother   . Alcohol abuse Father   . Cirrhosis Father   . Alcohol abuse Brother   . Cirrhosis Brother   . Heart attack Maternal Grandmother   . Diabetes Paternal Grandmother     Social History Social History  Substance Use Topics  . Smoking status: Current Every Day Smoker    Packs/day:  0.25    Years: 20.00    Types: Cigarettes  . Smokeless tobacco: Never Used  . Alcohol use No    No Known Allergies  Current Outpatient Prescriptions  Medication Sig Dispense Refill  . hydrochlorothiazide (MICROZIDE) 12.5 MG capsule TAKE 1 CAPSULE (12.5 MG TOTAL) BY MOUTH DAILY.  0  . ibuprofen (ADVIL,MOTRIN) 800 MG tablet Take 1 tablet (800 mg total) by mouth every 8 (eight) hours as needed (with food). 15 tablet 0  . polyethylene glycol powder (GLYCOLAX/MIRALAX) powder Take 17 g by mouth daily. (Patient not taking: Reported on 09/08/2016) 3350 g 1   No current facility-administered medications for this visit.     Review of Systems Review of Systems  Constitutional: Negative.   Respiratory: Negative.   Cardiovascular: Negative.   Gastrointestinal: Positive for abdominal distention, abdominal pain and constipation. Negative for anal bleeding, blood in stool, diarrhea, nausea, rectal pain and vomiting.    Blood pressure 130/80, pulse 72, resp. rate 14, height 5\' 3"  (1.6 m), weight 221 lb (100.2 kg).  Physical Exam Physical Exam  Constitutional: She is oriented to person, place, and time. She appears well-developed and well-nourished.  Eyes: Conjunctivae are normal. No scleral icterus.  Neck: Neck supple.  Cardiovascular: Normal rate, regular rhythm, normal  heart sounds, intact distal pulses and normal pulses.   1+ edema bilateral ankles   Pulmonary/Chest: Effort normal and breath sounds normal.  Abdominal: Soft. Normal appearance. There is no tenderness. A hernia (small umbilical) is present.  Epigastric hernia scar 4 cm above the umbilicus. Diastasis Recti noted.      Genitourinary: Rectum normal. Rectal exam shows guaiac negative stool.  Lymphadenopathy:    She has no cervical adenopathy.  Neurological: She is alert and oriented to person, place, and time.  Skin: Skin is warm and dry.  Psychiatric: She has a normal mood and affect.    Data Reviewed CT scan of the  abdomen and pelvis dated 08/14/2016 was reviewed and this study suggested moderate redundancy of the sigmoid colon, but this is not anywhere outside normal based on her age and gender and there is no evidence of any proximal dilatation to suggest obstruction.  The 07/04/2015 abdominal ultrasound was reviewed. Fatty infiltration of the liver suggested on the latter study.  Plain films of the abdomen dated 08/30/2016 were reviewed.  CBC and basic metabolic panel dated 80/88/1103 were reviewed. Except for mild elevation of the serum glucose, all normal.   Assessment    Nonspecific abdominal pain, likely irritable bowel.    Plan    At this time, there is no indication for surgical intervention. The multiple imaging studies the patient has been subjected to have been reviewed, and the results discussed with the patient. Based on her CT there is no evidence of a particularly redundant colon or abnormal location.  The tiny fascial defect of the umbilicus is not felt to be symptomatic and does not warrant repair at this time.  The patient has previously been evaluated by Lucilla Lame, M.D. from GI and is scheduled for follow-up there month. She is encouraged to review with him her concerns about bowel function and bloating. In the interval, she's been asked to make use of MiraLAX on a daily basis to help provide more regular illumination. As she found when she used a full cap full, she would have diarrhea, she's been asked use one half cap fulll daily. This dose may be adjusted up or down his needed to provide regular, soft elimination.     HPI, Physical Exam, Assessment and Plan have been scribed under the direction and in the presence of Robert Bellow, MD  Concepcion Living, LPN  I have completed the exam and reviewed the above documentation for accuracy and completeness.  I agree with the above.  Haematologist has been used and any errors in dictation or transcription are  unintentional.  Hervey Ard, M.D., F.A.C.S.  Robert Bellow 09/09/2016, 8:07 PM

## 2016-09-30 ENCOUNTER — Ambulatory Visit: Payer: 59 | Admitting: Gastroenterology

## 2016-10-11 ENCOUNTER — Other Ambulatory Visit: Payer: Self-pay | Admitting: Family Medicine

## 2016-11-18 ENCOUNTER — Other Ambulatory Visit: Payer: Self-pay | Admitting: Certified Nurse Midwife

## 2016-11-18 ENCOUNTER — Other Ambulatory Visit: Payer: Self-pay | Admitting: Obstetrics and Gynecology

## 2016-11-18 DIAGNOSIS — Z1231 Encounter for screening mammogram for malignant neoplasm of breast: Secondary | ICD-10-CM

## 2017-01-14 ENCOUNTER — Ambulatory Visit
Admission: RE | Admit: 2017-01-14 | Discharge: 2017-01-14 | Disposition: A | Discharge status: Home / Self Care | Payer: 59 | Source: Ambulatory Visit | Attending: Obstetrics and Gynecology | Admitting: Obstetrics and Gynecology

## 2017-01-14 DIAGNOSIS — Z1231 Encounter for screening mammogram for malignant neoplasm of breast: Secondary | ICD-10-CM | POA: Diagnosis present

## 2017-01-20 ENCOUNTER — Other Ambulatory Visit: Payer: Self-pay | Admitting: *Deleted

## 2017-01-20 ENCOUNTER — Inpatient Hospital Stay
Admission: RE | Admit: 2017-01-20 | Discharge: 2017-01-20 | Disposition: A | Discharge status: Home / Self Care | Payer: Self-pay | Source: Ambulatory Visit | Attending: *Deleted | Admitting: *Deleted

## 2017-01-20 DIAGNOSIS — Z9289 Personal history of other medical treatment: Secondary | ICD-10-CM

## 2017-01-22 ENCOUNTER — Other Ambulatory Visit: Payer: Self-pay | Admitting: *Deleted

## 2017-01-22 ENCOUNTER — Inpatient Hospital Stay
Admission: RE | Admit: 2017-01-22 | Discharge: 2017-01-22 | Disposition: A | Discharge status: Home / Self Care | Payer: Self-pay | Source: Ambulatory Visit | Attending: *Deleted | Admitting: *Deleted

## 2017-01-22 DIAGNOSIS — Z9289 Personal history of other medical treatment: Secondary | ICD-10-CM

## 2017-03-03 ENCOUNTER — Ambulatory Visit (INDEPENDENT_AMBULATORY_CARE_PROVIDER_SITE_OTHER): Payer: 59 | Admitting: Family Medicine

## 2017-03-03 ENCOUNTER — Encounter: Payer: Self-pay | Admitting: Family Medicine

## 2017-03-03 VITALS — BP 124/76 | HR 110 | Temp 98.5°F | Resp 18 | Ht 64.0 in | Wt 220.3 lb

## 2017-03-03 DIAGNOSIS — R0602 Shortness of breath: Secondary | ICD-10-CM | POA: Diagnosis not present

## 2017-03-03 DIAGNOSIS — R05 Cough: Secondary | ICD-10-CM | POA: Diagnosis not present

## 2017-03-03 DIAGNOSIS — Z720 Tobacco use: Secondary | ICD-10-CM

## 2017-03-03 DIAGNOSIS — R062 Wheezing: Secondary | ICD-10-CM | POA: Diagnosis not present

## 2017-03-03 DIAGNOSIS — J0100 Acute maxillary sinusitis, unspecified: Secondary | ICD-10-CM | POA: Diagnosis not present

## 2017-03-03 DIAGNOSIS — R059 Cough, unspecified: Secondary | ICD-10-CM

## 2017-03-03 MED ORDER — DOXYCYCLINE HYCLATE 100 MG PO TABS: 100.0000 mg | ORAL_TABLET | Freq: Two times a day (BID) | ORAL | 0 refills | Status: DC

## 2017-03-03 MED ORDER — BENZONATATE 100 MG PO CAPS: 100.0000 mg | ORAL_CAPSULE | Freq: Three times a day (TID) | ORAL | 0 refills | Status: DC | PRN

## 2017-03-03 MED ORDER — ALBUTEROL SULFATE HFA 108 (90 BASE) MCG/ACT IN AERS
2.0000 | INHALATION_SPRAY | Freq: Four times a day (QID) | RESPIRATORY_TRACT | 2 refills | Status: DC | PRN
Start: 2017-03-03 — End: 2017-10-13

## 2017-03-03 MED ORDER — PREDNISONE 20 MG PO TABS: 20.0000 mg | ORAL_TABLET | Freq: Two times a day (BID) | ORAL | 0 refills | Status: AC

## 2017-03-03 NOTE — Progress Notes (Addendum)
Name: Brittany Castro   MRN: [id number]    DOB: 1960/12/28   Date:03/03/2017       Progress Note  Subjective  Chief Complaint  Chief Complaint  Patient presents with  . Wheezing    bodyache, cills for 2 weeks  . Cough    HPI  Pt presents with 2 weeks of wheezing, shortness of breath, body aches, chest tightness and "raw" feeling when she coughs. Endorses chills but denies fevers; endorses dry cough, nasal congestion with sinus pain and pressure, and nausea with coughing fits. No ear pain/pressure, abdominal pain, vomiting, or diarrhea.  She has HTN, is not currently taking her HCTZ - she is avoiding decongestants - taking Theraflu at this time.  She is a current smoker - about 5-6 cigarettes per day.  Patient Active Problem List   Diagnosis Date Noted  . Abdominal bloating 08/29/2016  . Redundant colon 08/29/2016  . Generalized abdominal pain 08/19/2016  . Fatty liver 08/19/2016  . Benign hypertension 08/19/2016  . Tobacco abuse 08/19/2016  . Right upper quadrant abdominal pain 07/03/2015  . Cold sore 05/22/2015  . Obesity, Class II, BMI 35-39.9 03/13/2015  . Special screening for malignant neoplasms, colon   . Benign neoplasm of ascending colon   . Benign neoplasm of transverse colon   . Benign neoplasm of sigmoid colon   . Rectal polyp     Social History  Substance Use Topics  . Smoking status: Current Every Day Smoker    Packs/day: 0.25    Years: 20.00    Types: Cigarettes  . Smokeless tobacco: Never Used  . Alcohol use No     Current Outpatient Prescriptions:  .  hydrochlorothiazide (MICROZIDE) 12.5 MG capsule, TAKE 1 CAPSULE (12.5 MG TOTAL) BY MOUTH DAILY., Disp: 30 capsule, Rfl: 0 .  ibuprofen (ADVIL,MOTRIN) 800 MG tablet, Take 1 tablet (800 mg total) by mouth every 8 (eight) hours as needed (with food)., Disp: 15 tablet, Rfl: 0 .  polyethylene glycol powder (GLYCOLAX/MIRALAX) powder, Take 17 g by mouth daily. (Patient not taking: Reported on 09/08/2016), Disp:  3350 g, Rfl: 1  No Known Allergies  ROS  Ten systems reviewed and is negative except as mentioned in HPI   Objective  Vitals:   03/03/17 1030  BP: 124/76  Pulse: (!) 110  Resp: 18  Temp: 98.5 F (36.9 C)  TempSrc: Oral  SpO2: 94%  Weight: 220 lb 4.8 oz (99.9 kg)  Height: 5\' 4"  (1.626 m)   Body mass index is 37.81 kg/m.  Nursing Note and Vital Signs reviewed.  HR mildly elevated secondary to illness.  Physical Exam  Constitutional: Patient appears well-developed and well-nourished. Obese No distress.  HEENT: head atraumatic, normocephalic, pupils equal and reactive to light, EOM's intact, TM's without erythema or bulging, maxillary sinus pain on palpation bilaterally, neck supple with mild right-sided lymphadenopathy, oropharynx mildly erythematous and moist without exudate Cardiovascular: Normal rate, regular rhythm, S1/S2 present.  No murmur or rub heard. No BLE edema. Pulmonary/Chest: Effort normal and breath sounds expiratory wheezing throughout. No respiratory distress or retractions.  PT with dry cough throughout examination Psychiatric: Patient has a normal mood and affect. behavior is normal. Judgment and thought content normal.  No results found for this or any previous visit (from the past 2160 hour(s)).   Assessment & Plan  1. Acute non-recurrent maxillary sinusitis - doxycycline (VIBRA-TABS) 100 MG tablet; Take 1 tablet (100 mg total) by mouth 2 (two) times daily.  Dispense: 20 tablet; Refill: 0 -  CBC  2. Shortness of breath - predniSONE (DELTASONE) 20 MG tablet; Take 1 tablet (20 mg total) by mouth 2 (two) times daily with a meal. 2nd dose no later than 2pm  Dispense: 8 tablet; Refill: 0 - albuterol (PROVENTIL HFA;VENTOLIN HFA) 108 (90 Base) MCG/ACT inhaler; Inhale 2 puffs into the lungs every 6 (six) hours as needed for wheezing or shortness of breath.  Dispense: 1 Inhaler; Refill: 2 - CBC - Basic Metabolic Panel (BMET) - Pt declines Chest Xray due to  financial reasons, advised that if not improving in 3 days, we will re-evaluate and discuss this option again.  3. Wheezing - predniSONE (DELTASONE) 20 MG tablet; Take 1 tablet (20 mg total) by mouth 2 (two) times daily with a meal. 2nd dose no later than 2pm  Dispense: 8 tablet; Refill: 0 - albuterol (PROVENTIL HFA;VENTOLIN HFA) 108 (90 Base) MCG/ACT inhaler; Inhale 2 puffs into the lungs every 6 (six) hours as needed for wheezing or shortness of breath.  Dispense: 1 Inhaler; Refill: 2  4. Cough - benzonatate (TESSALON PERLES) 100 MG capsule; Take 1 capsule (100 mg total) by mouth 3 (three) times daily as needed.  Dispense: 30 capsule; Refill: 0 - CBC  5. Tobacco abuse Cessation discussed  -Red flags and when to present for emergency care or RTC including fever >101.28F, chest pain, shortness of breath, new/worsening/un-resolving symptoms, other symptoms in AVS reviewed with patient at time of visit. Follow up and care instructions discussed and provided in AVS. - Pt will call on Friday 03/06/2017 if not improving.  Work note provided until this date.

## 2017-03-03 NOTE — Patient Instructions (Addendum)
Cough, Adult A cough helps to clear your throat and lungs. A cough may last only 2-3 weeks (acute), or it may last longer than 8 weeks (chronic). Many different things can cause a cough. A cough may be a sign of an illness or another medical condition. Follow these instructions at home:  Pay attention to any changes in your cough.  Take medicines only as told by your doctor. ? If you were prescribed an antibiotic medicine, take it as told by your doctor. Do not stop taking it even if you start to feel better. ? Talk with your doctor before you try using a cough medicine.  Drink enough fluid to keep your pee (urine) clear or pale yellow.  If the air is dry, use a cold steam vaporizer or humidifier in your home.  Stay away from things that make you cough at work or at home.  If your cough is worse at night, try using extra pillows to raise your head up higher while you sleep.  Do not smoke, and try not to be around smoke. If you need help quitting, ask your doctor.  Do not have caffeine.  Do not drink alcohol.  Rest as needed. Contact a doctor if:  You have new problems (symptoms).  You cough up yellow fluid (pus).  Your cough does not get better after 2-3 weeks, or your cough gets worse.  Medicine does not help your cough and you are not sleeping well.  You have pain that gets worse or pain that is not helped with medicine.  You have a fever.  You are losing weight and you do not know why.  You have night sweats. Get help right away if:  You cough up blood.  You have trouble breathing.  Your heartbeat is very fast. This information is not intended to replace advice given to you by your health care provider. Make sure you discuss any questions you have with your health care provider. Document Released: 01/16/2011 Document Revised: 10/11/2015 Document Reviewed: 07/12/2014 Elsevier Interactive Patient Education  2018 [place] A cool mist  vaporizer is a device that releases a cool mist into the air. If you have a cough or a cold, using a vaporizer may help relieve your symptoms. The mist adds moisture to the air, which may help thin your mucus and make it less sticky. When your mucus is thin and less sticky, it easier for you to breathe and to cough up secretions. Do not use a vaporizer if you are allergic to mold. Follow these instructions at home:  Follow the instructions that come with the vaporizer.  Do not use anything other than distilled water in the vaporizer.  Do not run the vaporizer all of the time. Doing that can cause mold or bacteria to grow in the vaporizer.  Clean the vaporizer after each time that you use it.  Clean and dry the vaporizer well before storing it.  Stop using the vaporizer if your breathing symptoms get worse. This information is not intended to replace advice given to you by your health care provider. Make sure you discuss any questions you have with your health care provider. Document Released: 01/31/2004 Document Revised: 11/23/2015 Document Reviewed: 08/04/2015 Elsevier Interactive Patient Education  2018 [employer].  Sinusitis, Adult Sinusitis is soreness and inflammation of your sinuses. Sinuses are hollow spaces in the bones around your face. They are located:  Around your eyes.  In the middle of your forehead.  Behind your nose.  In your cheekbones.  Your sinuses and nasal passages are lined with a stringy fluid (mucus). Mucus normally drains out of your sinuses. When your nasal tissues get inflamed or swollen, the mucus can get trapped or blocked so air cannot flow through your sinuses. This lets bacteria, viruses, and funguses grow, and that leads to infection. Follow these instructions at home: Medicines  Take, use, or apply over-the-counter and prescription medicines only as told by your doctor. These may include nasal sprays.  If you were prescribed an antibiotic  medicine, take it as told by your doctor. Do not stop taking the antibiotic even if you start to feel better. Hydrate and Humidify  Drink enough water to keep your pee (urine) clear or pale yellow.  Use a cool mist humidifier to keep the humidity level in your home above 50%.  Breathe in steam for 10-15 minutes, 3-4 times a day or as told by your doctor. You can do this in the bathroom while a hot shower is running.  Try not to spend time in cool or dry air. Rest  Rest as much as possible.  Sleep with your head raised (elevated).  Make sure to get enough sleep each night. General instructions  Put a warm, moist washcloth on your face 3-4 times a day or as told by your doctor. This will help with discomfort.  Wash your hands often with soap and water. If there is no soap and water, use hand sanitizer.  Do not smoke. Avoid being around people who are smoking (secondhand smoke).  Keep all follow-up visits as told by your doctor. This is important. Contact a doctor if:  You have a fever.  Your symptoms get worse.  Your symptoms do not get better within 10 days. Get help right away if:  You have a very bad headache.  You cannot stop throwing up (vomiting).  You have pain or swelling around your face or eyes.  You have trouble seeing.  You feel confused.  Your neck is stiff.  You have trouble breathing. This information is not intended to replace advice given to you by your health care provider. Make sure you discuss any questions you have with your health care provider. Document Released: 10/22/2007 Document Revised: 12/30/2015 Document Reviewed: 02/28/2015 Elsevier Interactive Patient Education  2018 [place] of Breath, Adult Shortness of breath means you have trouble breathing. Your lungs are organs for breathing. Follow these instructions at home: Pay attention to any changes in your symptoms. Take these actions to help with your condition:  Do  not smoke. Smoking can cause shortness of breath. If you need help to quit smoking, ask your doctor.  Avoid things that can make it harder to breathe, such as: ? Mold. ? Dust. ? Air pollution. ? Chemical smells. ? Things that can cause allergy symptoms (allergens), if you have allergies.  Keep your living space clean and free of mold and dust.  Rest as needed. Slowly return to your usual activities.  Take over-the-counter and prescription medicines, including oxygen and inhaled medicines, only as told by your doctor.  Keep all follow-up visits as told by your doctor. This is important.  Contact a doctor if:  Your condition does not get better as soon as expected.  You have a hard time doing your normal activities, even after you rest.  You have new symptoms. Get help right away if:  You have trouble breathing when you are resting.  You  feel light-headed or you faint.  You have a cough that is not helped by medicines.  You cough up blood.  You have pain with breathing.  You have pain in your chest, arms, shoulders, or belly (abdomen).  You have a fever.  You cannot walk up stairs.  You cannot exercise the way you normally do. This information is not intended to replace advice given to you by your health care provider. Make sure you discuss any questions you have with your health care provider. Document Released: 10/22/2007 Document Revised: 05/22/2016 Document Reviewed: 05/22/2016 Elsevier Interactive Patient Education  2017 [employer]  Bronchospasm, Adult Bronchospasm is a tightening of the airways going into the lungs. During an episode, it may be harder to breathe. You may cough, and you may make a whistling sound when you breathe (wheeze). This condition often affects people with asthma. What are the causes? This condition is caused by swelling and irritation in the airways. It can be triggered by:  An infection (common).  Seasonal allergies.  An  allergic reaction.  Exercise.  Irritants. These include pollution, cigarette smoke, strong odors, aerosol sprays, and paint fumes.  Weather changes. Winds increase molds and pollens in the air. Cold air may cause swelling.  Stress and emotional upset.  What are the signs or symptoms? Symptoms of this condition include:  Wheezing. If the episode was triggered by an allergy, wheezing may start right away or hours later.  Nighttime coughing.  Frequent or severe coughing with a simple cold.  Chest tightness.  Shortness of breath.  Decreased ability to exercise.  How is this diagnosed? This condition is usually diagnosed with a review of your medical history and a physical exam. Tests, such as lung function tests, are sometimes done to look for other conditions. The need for a chest X-ray depends on where the wheezing occurs and whether it is the first time you have wheezed. How is this treated? This condition may be treated with:  Inhaled medicines. These open up the airways and help you breathe. They can be taken with an inhaler or a nebulizer device.  Corticosteroid medicines. These may be given for severe bronchospasm, usually when it is associated with asthma.  Avoiding triggers, such as irritants, infection, or allergies.  Follow these instructions at home: Medicines  Take over-the-counter and prescription medicines only as told by your health care provider.  If you need to use an inhaler or nebulizer to take your medicine, ask your health care provider to explain how to use it correctly. If you were given a spacer, always use it with your inhaler. Lifestyle  Reduce the number of triggers in your home. To do this: ? Change your heating and air conditioning filter at least once a month. ? Limit your use of fireplaces and wood stoves. ? Do not smoke. Do not allow smoking in your home. ? Avoid using perfumes and fragrances. ? Get rid of pests, such as roaches and mice,  and their droppings. ? Remove any mold from your home. ? Keep your house clean and dust free. Use unscented cleaning products. ? Replace carpet with wood, tile, or vinyl flooring. Carpet can trap dander and dust. ? Use allergy-proof pillows, mattress covers, and box spring covers. ? Wash bed sheets and blankets every week in hot water. Dry them in a dryer. ? Use blankets that are made of polyester or cotton. ? Wash your hands often. ? Do not allow pets in your bedroom.  Avoid breathing in  cold air when you exercise. General instructions  Have a plan for seeking medical care. Know when to call your health care provider and local emergency services, and where to get emergency care.  Stay up to date on your immunizations.  When you have an episode of bronchospasm, stay calm. Try to relax and breathe more slowly.  If you have asthma, make sure you have an asthma action plan.  Keep all follow-up visits as told by your health care provider. This is important. Contact a health care provider if:  You have muscle aches.  You have chest pain.  The mucus that you cough up (sputum) changes from clear or white to yellow, green, gray, or bloody.  You have a fever.  Your sputum gets thicker. Get help right away if:  Your wheezing and coughing get worse, even after you take your prescribed medicines.  It gets even harder to breathe.  You develop severe chest pain. Summary  Bronchospasm is a tightening of the airways going into the lungs.  During an episode of bronchospasm, you may have a harder time breathing. You may cough and make a whistling sound when you breathe (wheeze).  Avoid exposure to triggers such as smoke, dust, mold, animal dander, and fragrances.  When you have an episode of bronchospasm, stay calm. Try to relax and breathe more slowly. This information is not intended to replace advice given to you by your health care provider. Make sure you discuss any questions you  have with your health care provider. Document Released: 05/08/2003 Document Revised: 05/01/2016 Document Reviewed: 05/01/2016 Elsevier Interactive Patient Education  2017 [employer].

## 2017-03-04 LAB — BASIC METABOLIC PANEL
BUN/Creatinine Ratio: 19 (ref 9–23)
BUN: 12 mg/dL (ref 6–24)
CALCIUM: 9.2 mg/dL (ref 8.7–10.2)
CHLORIDE: 103 mmol/L (ref 96–106)
CO2: 22 mmol/L (ref 20–29)
Creatinine, Ser: 0.64 mg/dL (ref 0.57–1.00)
GFR calc non Af Amer: 101 mL/min/{1.73_m2} (ref >59)
GFR, EST AFRICAN AMERICAN: 116 mL/min/{1.73_m2} (ref >59)
GLUCOSE: 101 mg/dL — AB (ref 65–99)
POTASSIUM: 3.7 mmol/L (ref 3.5–5.2)
Sodium: 141 mmol/L (ref 134–144)

## 2017-03-04 LAB — CBC
Hematocrit: 40.2 % (ref 34.0–46.6)
Hemoglobin: 14.1 g/dL (ref 11.1–15.9)
MCH: 29 pg (ref 26.6–33.0)
MCHC: 35.1 g/dL (ref 31.5–35.7)
MCV: 83 fL (ref 79–97)
PLATELETS: 206 10*3/uL (ref 150–379)
RBC: 4.86 x10E6/uL (ref 3.77–5.28)
RDW: 14.7 % (ref 12.3–15.4)
WBC: 8.7 10*3/uL (ref 3.4–10.8)

## 2017-03-04 NOTE — Addendum Note (Signed)
Addended by: Raelyn Ensign E on: 03/04/2017 01:27 PM   Modules accepted: Level of Service

## 2017-03-06 ENCOUNTER — Other Ambulatory Visit: Payer: Self-pay | Admitting: Family Medicine

## 2017-03-06 ENCOUNTER — Telehealth: Payer: Self-pay | Admitting: Emergency Medicine

## 2017-03-06 ENCOUNTER — Encounter: Payer: Self-pay | Admitting: Emergency Medicine

## 2017-03-06 ENCOUNTER — Ambulatory Visit
Admission: RE | Admit: 2017-03-06 | Discharge: 2017-03-06 | Disposition: A | Discharge status: Home / Self Care | Payer: 59 | Source: Ambulatory Visit | Attending: Family Medicine | Admitting: Family Medicine

## 2017-03-06 DIAGNOSIS — R0602 Shortness of breath: Secondary | ICD-10-CM | POA: Insufficient documentation

## 2017-03-06 DIAGNOSIS — R062 Wheezing: Secondary | ICD-10-CM | POA: Diagnosis present

## 2017-03-06 DIAGNOSIS — R0989 Other specified symptoms and signs involving the circulatory and respiratory systems: Secondary | ICD-10-CM

## 2017-03-06 DIAGNOSIS — J0100 Acute maxillary sinusitis, unspecified: Secondary | ICD-10-CM

## 2017-03-06 MED ORDER — AMOXICILLIN-POT CLAVULANATE 875-125 MG PO TABS: 1.0000 | ORAL_TABLET | Freq: Two times a day (BID) | ORAL | 0 refills | Status: AC

## 2017-03-06 NOTE — Telephone Encounter (Signed)
She needs to have a chest Xray done today if she is not feeling better - Please call and let her know I've placed the order and she should go to [facility] to have this performed. This will help determine if she has pneumonia or not. I can provide work note after she has this done.

## 2017-03-06 NOTE — Telephone Encounter (Signed)
Patient will go for CXR

## 2017-03-06 NOTE — Telephone Encounter (Signed)
Seen on Tuesday. Will like note for 1 more day. Not feeling any better, cough, congested.

## 2017-03-06 NOTE — Telephone Encounter (Signed)
Patient notified

## 2017-10-13 ENCOUNTER — Emergency Department
Admission: EM | Admit: 2017-10-13 | Discharge: 2017-10-13 | Disposition: A | Discharge status: Home / Self Care | Payer: Managed Care, Other (non HMO) | Attending: Emergency Medicine | Admitting: Emergency Medicine

## 2017-10-13 ENCOUNTER — Emergency Department: Payer: Managed Care, Other (non HMO)

## 2017-10-13 ENCOUNTER — Encounter: Payer: Self-pay | Admitting: Emergency Medicine

## 2017-10-13 DIAGNOSIS — T148XXA Other injury of unspecified body region, initial encounter: Secondary | ICD-10-CM

## 2017-10-13 DIAGNOSIS — R0981 Nasal congestion: Secondary | ICD-10-CM | POA: Diagnosis not present

## 2017-10-13 DIAGNOSIS — R05 Cough: Secondary | ICD-10-CM

## 2017-10-13 DIAGNOSIS — Z79899 Other long term (current) drug therapy: Secondary | ICD-10-CM | POA: Diagnosis not present

## 2017-10-13 DIAGNOSIS — I10 Essential (primary) hypertension: Secondary | ICD-10-CM | POA: Diagnosis not present

## 2017-10-13 DIAGNOSIS — F17210 Nicotine dependence, cigarettes, uncomplicated: Secondary | ICD-10-CM | POA: Insufficient documentation

## 2017-10-13 DIAGNOSIS — R109 Unspecified abdominal pain: Secondary | ICD-10-CM | POA: Diagnosis present

## 2017-10-13 DIAGNOSIS — R102 Pelvic and perineal pain: Secondary | ICD-10-CM | POA: Insufficient documentation

## 2017-10-13 DIAGNOSIS — R059 Cough, unspecified: Secondary | ICD-10-CM

## 2017-10-13 LAB — URINALYSIS, COMPLETE (UACMP) WITH MICROSCOPIC
BILIRUBIN URINE: NEGATIVE
Bacteria, UA: NONE SEEN
Glucose, UA: NEGATIVE mg/dL
Ketones, ur: NEGATIVE mg/dL
LEUKOCYTES UA: NEGATIVE
NITRITE: NEGATIVE
Protein, ur: 30 mg/dL — AB
SPECIFIC GRAVITY, URINE: 1.012 (ref 1.005–1.030)
pH: 6 (ref 5.0–8.0)

## 2017-10-13 MED ORDER — TRAMADOL HCL 50 MG PO TABS
100.0000 mg | ORAL_TABLET | Freq: Once | ORAL | Status: AC
  Administered 2017-10-13: 100 mg via ORAL
  Filled 2017-10-13: qty 2

## 2017-10-13 MED ORDER — LIDOCAINE 5 % EX PTCH: 1.0000 | MEDICATED_PATCH | Freq: Two times a day (BID) | CUTANEOUS | 0 refills | Status: AC

## 2017-10-13 MED ORDER — ALBUTEROL SULFATE HFA 108 (90 BASE) MCG/ACT IN AERS: INHALATION_SPRAY | RESPIRATORY_TRACT | 1 refills | Status: DC

## 2017-10-13 MED ORDER — TRAMADOL HCL 50 MG PO TABS: ORAL_TABLET | ORAL | 0 refills | Status: DC

## 2017-10-13 MED ORDER — FLUTICASONE PROPIONATE 50 MCG/ACT NA SUSP: 1.0000 | Freq: Every day | NASAL | 1 refills | Status: AC

## 2017-10-13 NOTE — Discharge Instructions (Signed)

## 2017-10-13 NOTE — ED Notes (Signed)
ED Provider at bedside. 

## 2017-10-13 NOTE — ED Triage Notes (Signed)
Patient presents to the ED with left flank pain radiating into left side of pelvis and hip since Sunday.  Patient denies urinary complaints.  Patient states, "I've taken a whole ball of tylenol but it's not helping."  Patient appears uncomfortable during triage.

## 2017-10-13 NOTE — ED Notes (Signed)
Pt to CT

## 2017-10-13 NOTE — ED Notes (Signed)
Pt is AOx4, she is in bed with rails up, family is at the bedside, pt c/o left side hip pain that extends to her left lower abdomen, she denies any chest pain, headache, dizziness or shortness of breath. We will continue the pt.

## 2017-10-13 NOTE — ED Provider Notes (Signed)
[facility] Emergency Department Provider Note  ____________________________________________   First MD Initiated Contact with Patient 10/13/17 1424     (approximate)  I have reviewed the triage vital signs and the nursing notes.   HISTORY  Chief Complaint Flank Pain and Pelvic Pain    HPI Brittany Castro is a 57 y.o. female with no contributory past medical history who presents for evaluation of acute onset pain in her left flank that radiates around to the left side of her groin.  It is accompanied with some nausea but no vomiting.  She reports that it started acutely about 3 days ago and has been intermittent but persistent during that time.  The pain is sharp and stabbing but also aching at times.  She denies dysuria and hematuria.  She has been having some nasal congestion recently and a mild occasionally productive cough with white sputum but no difficulty breathing.  She denies chest pain, abdominal pain except for the pain on the left side that is radiating around from the flank, vomiting, headache.  Nothing particular makes her symptoms better or worse.  She has no history of kidney stones.  She has not been exercising or engaged in any activity that would have caused trauma or strain.  Past Medical History:  Diagnosis Date  . Benign hypertension 08/19/2016  . Fatty liver 08/19/2016   Moderate to severe on CT scan March 2018  . Hypertension   . Thyroid disease   . Tobacco abuse 08/19/2016    Patient Active Problem List   Diagnosis Date Noted  . Abdominal bloating 08/29/2016  . Redundant colon 08/29/2016  . Generalized abdominal pain 08/19/2016  . Fatty liver 08/19/2016  . Benign hypertension 08/19/2016  . Tobacco abuse 08/19/2016  . Right upper quadrant abdominal pain 07/03/2015  . Cold sore 05/22/2015  . Obesity, Class II, BMI 35-39.9 03/13/2015  . Special screening for malignant neoplasms, colon   . Benign neoplasm of ascending colon   . Benign  neoplasm of transverse colon   . Benign neoplasm of sigmoid colon   . Rectal polyp     Past Surgical History:  Procedure Laterality Date  . carpel tunnel    . COLONOSCOPY WITH PROPOFOL N/A 11/16/2014   Procedure: COLONOSCOPY WITH PROPOFOL;  Surgeon: Lucilla Lame, MD;  Location: [facility] ENDOSCOPY;  Service: Endoscopy;  Laterality: N/A;  . HERNIA REPAIR  2002   Dr Bary Castilla  . TENDON RELEASE    . TUBAL LIGATION  2002    Prior to Admission medications   Medication Sig Start Date End Date Taking? Authorizing Provider  albuterol (PROVENTIL HFA;VENTOLIN HFA) 108 (90 Base) MCG/ACT inhaler Inhale 2-4 puffs by mouth every 4 hours as needed for wheezing, cough, and/or shortness of breath 10/13/17   Hinda Kehr, MD  benzonatate (TESSALON PERLES) 100 MG capsule Take 1 capsule (100 mg total) by mouth 3 (three) times daily as needed. 03/03/17   Hubbard Hartshorn, FNP  fluticasone (FLONASE) 50 MCG/ACT nasal spray Place 1 spray into both nostrils daily. 10/13/17   Hinda Kehr, MD  hydrochlorothiazide (MICROZIDE) 12.5 MG capsule TAKE 1 CAPSULE (12.5 MG TOTAL) BY MOUTH DAILY. 10/12/16   Arnetha Courser, MD  ibuprofen (ADVIL,MOTRIN) 800 MG tablet Take 1 tablet (800 mg total) by mouth every 8 (eight) hours as needed (with food). 08/14/16   Eula Listen, MD  traMADol Veatrice Bourbon) 50 MG tablet Take 1-2 tablets by mouth every 6 hours as needed for moderate to severe pain 10/13/17  Hinda Kehr, MD    Allergies Patient has no known allergies.  Family History  Problem Relation Age of Onset  . Multiple sclerosis Mother   . Clotting disorder Mother   . Hyperlipidemia Mother   . Stroke Mother   . Alcohol abuse Father   . Cirrhosis Father   . Alcohol abuse Brother   . Cirrhosis Brother   . Heart attack Maternal Grandmother   . Diabetes Paternal Grandmother     Social History Social History   Tobacco Use  . Smoking status: Current Every Day Smoker    Packs/day: 0.25    Years: 20.00    Pack years:  5.00    Types: Cigarettes  . Smokeless tobacco: Never Used  Substance Use Topics  . Alcohol use: No  . Drug use: No    Review of Systems Constitutional: No fever/chills Eyes: No visual changes. ENT: Recent nasal congestion. Cardiovascular: Denies chest pain. Respiratory: Denies shortness of breath.  Mild intermittently productive cough Gastrointestinal: No abdominal pain except for some pain in the left side that is radiating from the left flank.  No nausea, no vomiting.  No diarrhea.  No constipation. Genitourinary: Negative for dysuria. Musculoskeletal: Pain in the left flank that radiates around to the left lower abdomen as described above Integumentary: Negative for rash. Neurological: Negative for headaches, focal weakness or numbness.   ____________________________________________   PHYSICAL EXAM:  VITAL SIGNS: ED Triage Vitals  Enc Vitals Group     BP 10/13/17 1254 (!) 177/124     Pulse Rate 10/13/17 1254 (!) 104     Resp 10/13/17 1254 18     Temp 10/13/17 1254 97.8 F (36.6 C)     Temp Source 10/13/17 1254 Oral     SpO2 10/13/17 1254 95 %     Weight 10/13/17 1256 102.1 kg (225 lb)     Height 10/13/17 1256 1.6 m (5\' 3" )     Head Circumference --      Peak Flow --      Pain Score 10/13/17 1255 10     Pain Loc --      Pain Edu? --      Excl. in [place]? --     Constitutional: Alert and oriented. Well appearing and in no acute distress. Eyes: Conjunctivae are normal.  Head: Atraumatic. Nose: Mild congestion/rhinnorhea. Mouth/Throat: Mucous membranes are moist. Neck: No stridor.  No meningeal signs.   Cardiovascular: Normal rate, regular rhythm. Good peripheral circulation. Grossly normal heart sounds. Respiratory: Normal respiratory effort.  No retractions. Lungs CTAB.  Frequent thick sounding cough. Gastrointestinal: Soft and nontender. No distention.  Musculoskeletal: No lower extremity tenderness nor edema. No gross deformities of extremities.  Left CVA  tenderness to percussion. Neurologic:  Normal speech and language. No gross focal neurologic deficits are appreciated.  Skin:  Skin is warm, dry and intact. No rash noted. Psychiatric: Mood and affect are normal. Speech and behavior are normal.  ____________________________________________   LABS (all labs ordered are listed, but only abnormal results are displayed)  Labs Reviewed  URINALYSIS, COMPLETE (UACMP) WITH MICROSCOPIC - Abnormal; Notable for the following components:      Result Value   Color, Urine YELLOW (*)    APPearance CLEAR (*)    Hgb urine dipstick SMALL (*)    Protein, ur 30 (*)    All other components within normal limits   ____________________________________________  EKG  None - EKG not ordered by ED physician ____________________________________________  [place]   ED MD  interpretation: No acute abnormalities on chest x-ray, no evidence of any acute or emergent condition on CT renal stone study.  Official radiology report(s): Dg Chest 2 View  Result Date: 10/13/2017 CLINICAL DATA:  Left lower thoracic pain. EXAM: CHEST - 2 VIEW COMPARISON:  03/06/2017 FINDINGS: The heart size and mediastinal contours are within normal limits. Stable mild pulmonary interstitial prominence is suggestive mild chronic lung disease. Scarring present at the left lung base. There is no evidence of pulmonary edema, consolidation, pneumothorax, nodule or pleural fluid. The visualized skeletal structures are unremarkable. IMPRESSION: No acute findings. Suggestion mild chronic interstitial lung disease and left basilar pulmonary scarring. Electronically Signed   By: Aletta Edouard M.D.   On: 10/13/2017 15:41   Ct Renal Stone Study  Result Date: 10/13/2017 CLINICAL DATA:  Left-sided flank pain radiating to the pelvis and hip beginning 2 days ago. EXAM: CT ABDOMEN AND PELVIS WITHOUT CONTRAST TECHNIQUE: Multidetector CT imaging of the abdomen and pelvis was performed following the  standard protocol without IV contrast. COMPARISON:  08/14/2016 FINDINGS: Lower chest: Mild chronic scarring at the lung bases. Hepatobiliary: Diffuse fatty change of the liver with some areas of focal sparing. No calcified gallstone. Pancreas: Normal Spleen: Normal Adrenals/Urinary Tract: Adrenal glands are normal. Kidneys are normal. No cyst, mass, stone or hydronephrosis. Stomach/Bowel: Normal. No evidence of obstruction or inflammatory process. Normal appendix. Vascular/Lymphatic: Minimal aortic atherosclerosis. No aneurysm. IVC is normal. No adenopathy. Reproductive: Normal.  No pelvic mass. Other: No free fluid or air. Musculoskeletal: Lower lumbar degenerative disc disease and degenerative facet disease. IMPRESSION: Diffuse fatty liver. No evidence of urinary tract stone disease or other urinary tract pathology. No specific cause of left-sided pain is identified. Aortic atherosclerosis, mild. Lower lumbar degenerative changes. Electronically Signed   By: Nelson Chimes M.D.   On: 10/13/2017 15:11    ____________________________________________   PROCEDURES  Critical Care performed: No   Procedure(s) performed:   Procedures   ____________________________________________   INITIAL IMPRESSION / ASSESSMENT AND PLAN / ED COURSE  As part of my medical decision making, I reviewed the following data within the [place] notes reviewed and incorporated, Labs reviewed , Radiograph reviewed , Notes from prior ED visits and [place] Controlled Substance Database    Differential diagnosis includes, but is not limited to, renal colic, musculoskeletal strain/sprain, pyelonephritis/UTI, pneumonia.  The patient is well-appearing in no acute distress.  Her vital signs are normal although she was slightly tachycardic at triage.  She does have a thick sounding cough but based on her symptoms I suspect that she has renal colic.  Her urinalysis was generally unremarkable.  CT renal study  protocol is pending.  If no obvious kidney stone is visualized I will likely get a two-view chest x-ray to make sure she does not have a lower lobe pneumonia that could be referring pain as well.  She agrees with plan.   Clinical Course as of Oct 14 1646  Tue Oct 13, 2017  1518 No acute findings on CT renal stone study.  Will obtain chest xrays as described above.  CT Renal Stone Study [CF]  1632 I reviewed the patient's prescription history over the last 24 months in the multi-state controlled substances database(s) that includes [place], [place], [place], [place], [place], [place], [place], [place], [place], [place], [place], [place], [place], [place], and [place].  Results were notable for only one prescription for medication which was prescribed nearly 2 years ago.  Low risk for  abuse potential.    [CF]  9794 Chest x-ray also shows no acute findings.  I suspect the patient is having musculoskeletal pain.  I will discuss with her, provide some pain medication, and recommend follow-up with her primary care provider.   [CF]    Clinical Course User Index [CF] Hinda Kehr, MD    ____________________________________________  FINAL CLINICAL IMPRESSION(S) / ED DIAGNOSES  Final diagnoses:  Left flank pain  Muscle strain  Cough  Congestion of nasal sinus     MEDICATIONS GIVEN DURING THIS VISIT:  Medications  traMADol (ULTRAM) tablet 100 mg (has no administration in time range)     ED Discharge Orders        Ordered    fluticasone (FLONASE) 50 MCG/ACT nasal spray  Daily     10/13/17 1642    albuterol (PROVENTIL HFA;VENTOLIN HFA) 108 (90 Base) MCG/ACT inhaler     10/13/17 1642    traMADol (ULTRAM) 50 MG tablet     10/13/17 1642       Note:  This document was prepared using Dragon voice recognition software and may include unintentional dictation errors.    Hinda Kehr, MD 10/13/17 [phone]

## 2017-10-28 ENCOUNTER — Ambulatory Visit: Payer: 59 | Admitting: Family Medicine

## 2017-10-28 ENCOUNTER — Encounter: Payer: Self-pay | Admitting: Family Medicine

## 2017-10-28 VITALS — BP 128/64 | HR 100 | Temp 98.4°F | Resp 16 | Ht 64.0 in | Wt 217.4 lb

## 2017-10-28 DIAGNOSIS — R059 Cough, unspecified: Secondary | ICD-10-CM

## 2017-10-28 DIAGNOSIS — R109 Unspecified abdominal pain: Secondary | ICD-10-CM

## 2017-10-28 DIAGNOSIS — R319 Hematuria, unspecified: Secondary | ICD-10-CM

## 2017-10-28 DIAGNOSIS — R3915 Urgency of urination: Secondary | ICD-10-CM | POA: Diagnosis not present

## 2017-10-28 DIAGNOSIS — M47816 Spondylosis without myelopathy or radiculopathy, lumbar region: Secondary | ICD-10-CM | POA: Insufficient documentation

## 2017-10-28 DIAGNOSIS — Z720 Tobacco use: Secondary | ICD-10-CM | POA: Diagnosis not present

## 2017-10-28 DIAGNOSIS — J849 Interstitial pulmonary disease, unspecified: Secondary | ICD-10-CM | POA: Diagnosis not present

## 2017-10-28 DIAGNOSIS — R05 Cough: Secondary | ICD-10-CM | POA: Diagnosis not present

## 2017-10-28 DIAGNOSIS — K760 Fatty (change of) liver, not elsewhere classified: Secondary | ICD-10-CM | POA: Diagnosis not present

## 2017-10-28 DIAGNOSIS — M51369 Other intervertebral disc degeneration, lumbar region without mention of lumbar back pain or lower extremity pain: Secondary | ICD-10-CM | POA: Insufficient documentation

## 2017-10-28 DIAGNOSIS — I700 Atherosclerosis of aorta: Secondary | ICD-10-CM | POA: Diagnosis not present

## 2017-10-28 DIAGNOSIS — I10 Essential (primary) hypertension: Secondary | ICD-10-CM

## 2017-10-28 DIAGNOSIS — M5136 Other intervertebral disc degeneration, lumbar region: Secondary | ICD-10-CM | POA: Diagnosis not present

## 2017-10-28 DIAGNOSIS — J410 Simple chronic bronchitis: Secondary | ICD-10-CM | POA: Insufficient documentation

## 2017-10-28 MED ORDER — AMOXICILLIN 250 MG/5ML PO SUSR: 500.0000 mg | Freq: Three times a day (TID) | ORAL | 0 refills | Status: DC

## 2017-10-28 MED ORDER — BENZONATATE 100 MG PO CAPS: 100.0000 mg | ORAL_CAPSULE | Freq: Three times a day (TID) | ORAL | 0 refills | Status: DC | PRN

## 2017-10-28 NOTE — Assessment & Plan Note (Addendum)
Discussed; weight loss is key; FMLA paperwork filled out; refer to PT; refer to ortho

## 2017-10-28 NOTE — Addendum Note (Signed)
Addended by: Docia Furl on: 10/28/2017 12:02 PM   Modules accepted: Orders

## 2017-10-28 NOTE — Assessment & Plan Note (Signed)
FMLA paperwork filled out; refer to PT; refer to ortho; weight loss is key

## 2017-10-28 NOTE — Assessment & Plan Note (Signed)
Discussed with patient; goal LDL less than 70; check lipids today

## 2017-10-28 NOTE — Assessment & Plan Note (Signed)
Weight loss is key; discussed finding with patient; check lipids and LFTs today

## 2017-10-28 NOTE — Assessment & Plan Note (Signed)
Controlled today 

## 2017-10-28 NOTE — Addendum Note (Signed)
Addended by: Docia Furl on: 10/28/2017 11:48 AM   Modules accepted: Orders

## 2017-10-28 NOTE — Assessment & Plan Note (Addendum)
Noted on CXR in May 2019; refer to pulmonologist; smoking cessation is key; symptomatic

## 2017-10-28 NOTE — Assessment & Plan Note (Signed)
Urged weight loss; clock starting now; 1 pound per week; see AVS; refer to nutritionist

## 2017-10-28 NOTE — Assessment & Plan Note (Signed)
Urged patient to quit smoking; clock starts now; she plans to quit cold [place]; see AVS

## 2017-10-28 NOTE — Progress Notes (Signed)
BP 128/64   Pulse 100   Temp 98.4 F (36.9 C) (Oral)   Resp 16   Ht 5\' 4"  (1.626 m)   Wt 217 lb 6.4 oz (98.6 kg)   SpO2 93%   BMI 37.32 kg/m    Subjective:    Patient ID: Brittany Castro, female    DOB: June 24, 1960, 57 y.o.   MRN: [id number]  HPI: Brittany Castro is a 57 y.o. female  Chief Complaint  Patient presents with  . Back Pain    lower FLMA  . URI    onset 3 weeks went to ER, still having cough    HPI Patient is here for several issues  She has low back pain and is requesting paperwork for FMLA  She has an upper respiratory infection; going on for 3 weeks; went to the ER pm 10/13/2017; note reviewed; for left flank pain, muscle strain, cough, congestion; ER note says flank pain, radiating around the left side; started acutely 3 days prior to visit; stabbing and aching; no hx of kidney stones; no exercise or trauma or activity to cause strain; her BP was markedly elevated in the ER, 177/124, heart rate 104; she had left sided CVA tenderness; urine was positive for blood and protein CT scan showed lumbar degenerative disc disease, fatty liver; aortic athero Still having cough; her CXR in the ER showed mild chronic interstitial lung disease; left basilar scarring Cough actually worsened over the last week; fever earlier, but just laying down and rest; having to lay down and rest; her supervisor told her to get paperwork done; she has been out some in October She is still smoking; "I was, but I gotta quit"; she is through, she can quit cold [place] No visible blood in the urine; urine is yellow; no foam or cloudy appearance; increased frequency; no burning with urination; pressure She was out of work two days after [facility] Day; she was having back pain; she couldn't walk, they had to get her a wheelchair  Depression screen [facility] 2/9 10/28/2017 08/29/2016 08/19/2016 05/22/2015 03/13/2015  Decreased Interest 0 0 0 0 0  Down, Depressed, Hopeless 0 0 0 0 0  PHQ - 2 Score 0 0 0 0 0     Relevant past medical, surgical, family and social history reviewed Past Medical History:  Diagnosis Date  . Benign hypertension 08/19/2016  . Fatty liver 08/19/2016   Moderate to severe on CT scan March 2018  . Hypertension   . Thyroid disease   . Tobacco abuse 08/19/2016   Past Surgical History:  Procedure Laterality Date  . carpel tunnel    . COLONOSCOPY WITH PROPOFOL N/A 11/16/2014   Procedure: COLONOSCOPY WITH PROPOFOL;  Surgeon: Lucilla Lame, MD;  Location: [facility] ENDOSCOPY;  Service: Endoscopy;  Laterality: N/A;  . HERNIA REPAIR  2002   Dr Bary Castilla  . TENDON RELEASE    . TUBAL LIGATION  2002   Family History  Problem Relation Age of Onset  . Multiple sclerosis Mother   . Clotting disorder Mother   . Hyperlipidemia Mother   . Stroke Mother   . Alcohol abuse Father   . Cirrhosis Father   . Alcohol abuse Brother   . Cirrhosis Brother   . Heart attack Maternal Grandmother   . Diabetes Paternal Grandmother    Social History   Tobacco Use  . Smoking status: Current Every Day Smoker    Packs/day: 0.25    Years: 20.00    Pack years: 5.00  Types: Cigarettes  . Smokeless tobacco: Never Used  Substance Use Topics  . Alcohol use: No  . Drug use: No    Interim medical history since last visit reviewed. Allergies and medications reviewed  Review of Systems Per HPI unless specifically indicated above     Objective:    BP 128/64   Pulse 100   Temp 98.4 F (36.9 C) (Oral)   Resp 16   Ht 5\' 4"  (1.626 m)   Wt 217 lb 6.4 oz (98.6 kg)   SpO2 93%   BMI 37.32 kg/m   Wt Readings from Last 3 Encounters:  10/28/17 217 lb 6.4 oz (98.6 kg)  10/13/17 225 lb (102.1 kg)  03/03/17 220 lb 4.8 oz (99.9 kg)    Physical Exam  Constitutional: She appears well-developed and well-nourished. No distress.  Morbid obesity  HENT:  Head: Normocephalic and atraumatic.  Eyes: EOM are normal. No scleral icterus.  Neck: No thyromegaly present.  Cardiovascular: Normal rate, regular  rhythm and normal heart sounds.  No murmur heard. Pulmonary/Chest: Effort normal. No respiratory distress. She has decreased breath sounds (superiorly). She has no wheezes.  Frequent cough; dyspneic getting up on to the exam table  Abdominal: Soft. Bowel sounds are normal. She exhibits no distension. There is no tenderness.  Musculoskeletal: She exhibits no edema.       Lumbar back: She exhibits decreased range of motion and tenderness. She exhibits no spasm.  Neurological: She is alert. She exhibits normal muscle tone.  Leg extension 5/5  Skin: Skin is warm and dry. She is not diaphoretic. No pallor.  Psychiatric: She has a normal mood and affect. Her behavior is normal. Judgment and thought content normal. Her mood appears not anxious. She does not exhibit a depressed mood.    Results for orders placed or performed during the hospital encounter of 10/13/17  Urinalysis, Complete w Microscopic  Result Value Ref Range   Color, Urine YELLOW (A) YELLOW   APPearance CLEAR (A) CLEAR   Specific Gravity, Urine 1.012 1.005 - 1.030   pH 6.0 5.0 - 8.0   Glucose, UA NEGATIVE NEGATIVE mg/dL   Hgb urine dipstick SMALL (A) NEGATIVE   Bilirubin Urine NEGATIVE NEGATIVE   Ketones, ur NEGATIVE NEGATIVE mg/dL   Protein, ur 30 (A) NEGATIVE mg/dL   Nitrite NEGATIVE NEGATIVE   Leukocytes, UA NEGATIVE NEGATIVE   RBC / HPF 0-5 0 - 5 RBC/hpf   WBC, UA 0-5 0 - 5 WBC/hpf   Bacteria, UA NONE SEEN NONE SEEN   Squamous Epithelial / LPF 0-5 0 - 5   Mucus PRESENT       Assessment & Plan:   Problem List Items Addressed This Visit      Cardiovascular and Mediastinum   Benign hypertension    Controlled today      Relevant Medications   aspirin EC 81 MG tablet   Aortic atherosclerosis ([place])    Discussed with patient; goal LDL less than 70; check lipids today      Relevant Medications   aspirin EC 81 MG tablet   Other Relevant Orders   Lipid panel   Amb ref to Medical Nutrition Therapy-MNT      Respiratory   Interstitial lung disease ([place])    Noted on CXR in May 2019; refer to pulmonologist; smoking cessation is key; symptomatic      Relevant Orders   Ambulatory referral to Pulmonology   Amb ref to Medical Nutrition Therapy-MNT     Digestive  Fatty liver    Weight loss is key; discussed finding with patient; check lipids and LFTs today      Relevant Orders   Amb ref to Medical Nutrition Therapy-MNT     Musculoskeletal and Integument   Facet arthritis, degenerative, lumbar spine    Discussed; weight loss is key; FMLA paperwork filled out; refer to PT; refer to ortho      Relevant Medications   aspirin EC 81 MG tablet   Other Relevant Orders   Ambulatory referral to Orthopedic Surgery   Ambulatory referral to Physical Therapy   Degenerative disc disease, lumbar    FMLA paperwork filled out; refer to PT; refer to ortho; weight loss is key      Relevant Medications   aspirin EC 81 MG tablet   Other Relevant Orders   Ambulatory referral to Orthopedic Surgery   Ambulatory referral to Physical Therapy     Other   Tobacco abuse    Urged patient to quit smoking; clock starts now; she plans to quit cold [place]; see AVS      Morbid obesity ([place])    Urged weight loss; clock starting now; 1 pound per week; see AVS; refer to nutritionist      Relevant Orders   Amb ref to Medical Nutrition Therapy-MNT    Other Visit Diagnoses    Flank pain    -  Primary   reviewed CT scan, urine; recheck urine today   Relevant Orders   CBC with Differential/Platelet   COMPLETE METABOLIC PANEL WITH GFR   Cough       may be due to ILD, but this has worsened since ER visit, now sounds like bronchitis or infectious process; doxy, refer to pulm   Relevant Medications   benzonatate (TESSALON PERLES) 100 MG capsule   Other Relevant Orders   Ambulatory referral to Pulmonology   Urinary urgency       check urine today   Hematuria, unspecified type       check urine today; if related  to infection and clears, no referral to uro needed; if no infection and RBCs on micro, refer to uro   Relevant Orders   Urinalysis w microscopic + reflex cultur       Follow up plan: Return in about 3 weeks (around 11/18/2017) for follow-up visit with Dr. Sanda Klein.  An after-visit summary was printed and given to the patient at [facility].  Please see the patient instructions which may contain other information and recommendations beyond what is mentioned above in the assessment and plan.  Meds ordered this encounter  Medications  . amoxicillin (AMOXIL) 250 MG/5ML suspension    Sig: Take 10 mLs (500 mg total) by mouth 3 (three) times daily.    Dispense:  300 mL    Refill:  0  . benzonatate (TESSALON PERLES) 100 MG capsule    Sig: Take 1 capsule (100 mg total) by mouth 3 (three) times daily as needed.    Dispense:  30 capsule    Refill:  0    Orders Placed This Encounter  Procedures  . Urinalysis w microscopic + reflex cultur  . CBC with Differential/Platelet  . COMPLETE METABOLIC PANEL WITH GFR  . Lipid panel  . Ambulatory referral to Pulmonology  . Ambulatory referral to Orthopedic Surgery  . Ambulatory referral to Physical Therapy  . Amb ref to Medical Nutrition Therapy-MNT

## 2017-10-28 NOTE — Patient Instructions (Addendum)
Check out the information at familydoctor.org entitled "Nutrition for Weight Loss: What You Need to Know about Fad Diets" Try to lose between 1 pound per week by taking in fewer calories and burning off more calories You can succeed by limiting portions, limiting foods dense in calories and fat, becoming more active, and drinking 8 glasses of water a day (64 ounces) Don't skip meals, especially breakfast, as skipping meals may alter your metabolism Do not use over-the-counter weight loss pills or gimmicks that claim rapid weight loss A healthy BMI (or body mass index) is between 18.5 and 24.9 You can calculate your ideal BMI at the [employer] website ClubMonetize.fr I do encourage you to quit smoking Call [phone] to sign up for smoking cessation classes You can call 1-800-QUIT-NOW to talk with a smoking cessation coach Please do eat yogurt or kimchi or take a probiotic daily for the next month We want to replace the healthy germs in the gut If you notice foul, watery diarrhea in the next two months, schedule an appointment RIGHT AWAY or go to an urgent care or the emergency room if a holiday or over a weekend We'll have you see the lung doctor, the back doctor, the physical therapist, and the nutritionist/dietician  Steps to Quit Smoking Smoking tobacco can be bad for your health. It can also affect almost every organ in your body. Smoking puts you and people around you at risk for many serious long-lasting (chronic) diseases. Quitting smoking is hard, but it is one of the best things that you can do for your health. It is never too late to quit. What are the benefits of quitting smoking? When you quit smoking, you lower your risk for getting serious diseases and conditions. They can include:  Lung cancer or lung disease.  Heart disease.  Stroke.  Heart attack.  Not being able to have children (infertility).  Weak bones (osteoporosis)  and broken bones (fractures).  If you have coughing, wheezing, and shortness of breath, those symptoms may get better when you quit. You may also get sick less often. If you are pregnant, quitting smoking can help to lower your chances of having a baby of low birth weight. What can I do to help me quit smoking? Talk with your doctor about what can help you quit smoking. Some things you can do (strategies) include:  Quitting smoking totally, instead of slowly cutting back how much you smoke over a period of time.  Going to in-person counseling. You are more likely to quit if you go to many counseling sessions.  Using resources and support systems, such as: ? Database administrator with a Social worker. ? Phone quitlines. ? Careers information officer. ? Support groups or group counseling. ? Text messaging programs. ? Mobile phone apps or applications.  Taking medicines. Some of these medicines may have nicotine in them. If you are pregnant or breastfeeding, do not take any medicines to quit smoking unless your doctor says it is okay. Talk with your doctor about counseling or other things that can help you.  Talk with your doctor about using more than one strategy at the same time, such as taking medicines while you are also going to in-person counseling. This can help make quitting easier. What things can I do to make it easier to quit? Quitting smoking might feel very hard at first, but there is a lot that you can do to make it easier. Take these steps:  Talk to your family and friends. Ask them  to support and encourage you.  Call phone quitlines, reach out to support groups, or work with a Social worker.  Ask people who smoke to not smoke around you.  Avoid places that make you want (trigger) to smoke, such as: ? Bars. ? Parties. ? Smoke-break areas at work.  Spend time with people who do not smoke.  Lower the stress in your life. Stress can make you want to smoke. Try these things to help your  stress: ? Getting regular exercise. ? Deep-breathing exercises. ? Yoga. ? Meditating. ? Doing a body scan. To do this, close your eyes, focus on one area of your body at a time from head to toe, and notice which parts of your body are tense. Try to relax the muscles in those areas.  Download or buy apps on your mobile phone or tablet that can help you stick to your quit plan. There are many free apps, such as QuitGuide from the [employer] Office manager for Disease Control and Prevention). You can find more support from smokefree.gov and other websites.  This information is not intended to replace advice given to you by your health care provider. Make sure you discuss any questions you have with your health care provider. Document Released: 03/01/2009 Document Revised: 01/01/2016 Document Reviewed: 09/19/2014 Elsevier Interactive Patient Education  2018 [employer].  Obesity, Adult Obesity is the condition of having too much total body fat. Being overweight or obese means that your weight is greater than what is considered healthy for your body size. Obesity is determined by a measurement called BMI. BMI is an estimate of body fat and is calculated from height and weight. For adults, a BMI of 30 or higher is considered obese. Obesity can eventually lead to other health concerns and major illnesses, including:  Stroke.  Coronary artery disease (CAD).  Type 2 diabetes.  Some types of cancer, including cancers of the colon, breast, uterus, and gallbladder.  Osteoarthritis.  High blood pressure (hypertension).  High cholesterol.  Sleep apnea.  Gallbladder stones.  Infertility problems.  What are the causes? The main cause of obesity is taking in (consuming) more calories than your body uses for energy. Other factors that contribute to this condition may include:  Being born with genes that make you more likely to become obese.  Having a medical condition that causes obesity. These  conditions include: ? Hypothyroidism. ? Polycystic ovarian syndrome (PCOS). ? Binge-eating disorder. ? Cushing syndrome.  Taking certain medicines, such as steroids, antidepressants, and seizure medicines.  Not being physically active (sedentary lifestyle).  Living where there are limited places to exercise safely or buy healthy foods.  Not getting enough sleep.  What increases the risk? The following factors may increase your risk of this condition:  Having a family history of obesity.  Being a woman of African-American descent.  Being a man of Hispanic descent.  What are the signs or symptoms? Having excessive body fat is the main symptom of this condition. How is this diagnosed? This condition may be diagnosed based on:  Your symptoms.  Your medical history.  A physical exam. Your health care provider may measure: ? Your BMI. If you are an adult with a BMI between 25 and less than 30, you are considered overweight. If you are an adult with a BMI of 30 or higher, you are considered obese. ? The distances around your hips and your waist (circumferences). These may be compared to each other to help diagnose your condition. ? Your skinfold  thickness. Your health care provider may gently pinch a fold of your skin and measure it.  How is this treated? Treatment for this condition often includes changing your lifestyle. Treatment may include some or all of the following:  Dietary changes. Work with your health care provider and a dietitian to set a weight-loss goal that is healthy and reasonable for you. Dietary changes may include eating: ? Smaller portions. A portion size is the amount of a particular food that is healthy for you to eat at one time. This varies from person to person. ? Low-calorie or low-fat options. ? More whole grains, fruits, and vegetables.  Regular physical activity. This may include aerobic activity (cardio) and strength training.  Medicine to help  you lose weight. Your health care provider may prescribe medicine if you are unable to lose 1 pound a week after 6 weeks of eating more healthily and doing more physical activity.  Surgery. Surgical options may include gastric banding and gastric bypass. Surgery may be done if: ? Other treatments have not helped to improve your condition. ? You have a BMI of 40 or higher. ? You have life-threatening health problems related to obesity.  Follow these instructions at home:  Eating and drinking   Follow recommendations from your health care provider about what you eat and drink. Your health care provider may advise you to: ? Limit fast foods, sweets, and processed snack foods. ? Choose low-fat options, such as low-fat milk instead of whole milk. ? Eat 5 or more servings of fruits or vegetables every day. ? Eat at home more often. This gives you more control over what you eat. ? Choose healthy foods when you eat out. ? Learn what a healthy portion size is. ? Keep low-fat snacks on hand. ? Avoid sugary drinks, such as soda, fruit juice, iced tea sweetened with sugar, and flavored milk. ? Eat a healthy breakfast.  Drink enough water to keep your urine clear or pale yellow.  Do not go without eating for long periods of time (do not fast) or follow a fad diet. Fasting and fad diets can be unhealthy and even dangerous. Physical Activity  Exercise regularly, as told by your health care provider. Ask your health care provider what types of exercise are safe for you and how often you should exercise.  Warm up and stretch before being active.  Cool down and stretch after being active.  Rest between periods of activity. Lifestyle  Limit the time that you spend in front of your TV, computer, or video game system.  Find ways to reward yourself that do not involve food.  Limit alcohol intake to no more than 1 drink a day for nonpregnant women and 2 drinks a day for men. One drink equals 12 oz  of beer, 5 oz of wine, or 1 oz of hard liquor. General instructions  Keep a weight loss journal to keep track of the food you eat and how much you exercise you get.  Take over-the-counter and prescription medicines only as told by your health care provider.  Take vitamins and supplements only as told by your health care provider.  Consider joining a support group. Your health care provider may be able to recommend a support group.  Keep all follow-up visits as told by your health care provider. This is important. Contact a health care provider if:  You are unable to meet your weight loss goal after 6 weeks of dietary and lifestyle changes. This information  is not intended to replace advice given to you by your health care provider. Make sure you discuss any questions you have with your health care provider. Document Released: 06/12/2004 Document Revised: 10/08/2015 Document Reviewed: 02/21/2015 Elsevier Interactive Patient Education  2018 [employer].  Preventing Unhealthy [employer], Adult Staying at a healthy weight is important. When fat builds up in your body, you may become overweight or obese. These conditions put you at greater risk for developing certain health problems, such as heart disease, diabetes, sleeping problems, joint problems, and some cancers. Unhealthy weight gain is often the result of making unhealthy choices in what you eat. It is also a result of not getting enough exercise. You can make changes to your lifestyle to prevent obesity and stay as healthy as possible. What nutrition changes can be made? To maintain a healthy weight and prevent obesity:  Eat only as much as your body needs. To do this: ? Pay attention to signs that you are hungry or full. Stop eating as soon as you feel full. ? If you feel hungry, try drinking water first. Drink enough water so your urine is clear or pale yellow. ? Eat smaller portions. ? Look at serving sizes on food labels. Most  foods contain more than one serving per container. ? Eat the recommended amount of calories for your gender and activity level. While most active people should eat around 2,000 calories per day, if you are trying to lose weight or are not very active, you main need to eat less calories. Talk to your health care provider or dietitian about how many calories you should eat each day.  Choose healthy foods, such as: ? Fruits and vegetables. Try to fill at least half of your plate at each meal with fruits and vegetables. ? Whole grains, such as whole wheat bread, brown rice, and quinoa. ? Lean meats, such as chicken or fish. ? Other healthy proteins, such as beans, eggs, or tofu. ? Healthy fats, such as nuts, seeds, fatty fish, and olive oil. ? Low-fat or fat-free dairy.  Check food labels and avoid food and drinks that: ? Are high in calories. ? Have added sugar. ? Are high in sodium. ? Have saturated fats or trans fats.  Limit how much you eat of the following foods: ? Prepackaged meals. ? Fast food. ? Fried foods. ? Processed meat, such as bacon, sausage, and deli meats. ? Fatty cuts of red meat and poultry with skin.  Cook foods in healthier ways, such as by baking, broiling, or grilling.  When grocery shopping, try to shop around the outside of the store. This helps you buy mostly fresh foods and avoid canned and prepackaged foods.  What lifestyle changes can be made?  Exercise at least 30 minutes 5 or more days each week. Exercising includes brisk walking, yard work, biking, running, swimming, and team sports like basketball and soccer. Ask your health care provider which exercises are safe for you.  Do not use any products that contain nicotine or tobacco, such as cigarettes and e-cigarettes. If you need help quitting, ask your health care provider.  Limit alcohol intake to no more than 1 drink a day for nonpregnant women and 2 drinks a day for men. One drink equals 12 oz of beer,  5 oz of wine, or 1 oz of hard liquor.  Try to get 7-9 hours of sleep each night. What other changes can be made?  Keep a food and activity journal to  keep track of: ? What you ate and how many calories you had. Remember to count sauces, dressings, and side dishes. ? Whether you were active, and what exercises you did. ? Your calorie, weight, and activity goals.  Check your weight regularly. Track any changes. If you notice you have gained weight, make changes to your diet or activity routine.  Avoid taking weight-loss medicines or supplements. Talk to your health care provider before starting any new medicine or supplement.  Talk to your health care provider before trying any new diet or exercise plan. Why are these changes important? Eating healthy, staying active, and having healthy habits not only help prevent obesity, they also:  Help you to manage stress and emotions.  Help you to connect with friends and family.  Improve your self-esteem.  Improve your sleep.  Prevent long-term health problems.  What can happen if changes are not made? Being obese or overweight can cause you to develop joint or bone problems, which can make it hard for you to stay active or do activities you enjoy. Being obese or overweight also puts stress on your heart and lungs and can lead to health problems like diabetes, heart disease, and some cancers. Where to find more information: Talk with your health care provider or a dietitian about healthy eating and healthy lifestyle choices. You may also find other information through these resources:  [place] Department of Agriculture MyPlate: FormerBoss.no  American Heart Association: www.heart.org  Centers for Disease Control and Prevention: http://www.wolf.info/  Summary  Staying at a healthy weight is important. It helps prevent certain diseases and health problems, such as heart disease, diabetes, joint problems, sleep disorders, and some  cancers.  Being obese or overweight can cause you to develop joint or bone problems, which can make it hard for you to stay active or do activities you enjoy.  You can prevent unhealthy weight gain by eating a healthy diet, exercising regularly, not smoking, limiting alcohol, and getting enough sleep.  Talk with your health care provider or a dietitian for guidance about healthy eating and healthy lifestyle choices. This information is not intended to replace advice given to you by your health care provider. Make sure you discuss any questions you have with your health care provider. Document Released: 05/06/2016 Document Revised: 06/11/2016 Document Reviewed: 06/11/2016 Elsevier Interactive Patient Education  [employer].

## 2017-10-29 LAB — COMPREHENSIVE METABOLIC PANEL
A/G RATIO: 1.5 (ref 1.2–2.2)
ALK PHOS: 142 IU/L — AB (ref 39–117)
ALT: 27 IU/L (ref 0–32)
AST: 24 IU/L (ref 0–40)
Albumin: 4.3 g/dL (ref 3.5–5.5)
BUN/Creatinine Ratio: 13 (ref 9–23)
BUN: 11 mg/dL (ref 6–24)
Bilirubin Total: 0.3 mg/dL (ref 0.0–1.2)
CHLORIDE: 104 mmol/L (ref 96–106)
CO2: 23 mmol/L (ref 20–29)
Calcium: 9.4 mg/dL (ref 8.7–10.2)
Creatinine, Ser: 0.83 mg/dL (ref 0.57–1.00)
GFR calc Af Amer: 91 mL/min/{1.73_m2} (ref >59)
GFR calc non Af Amer: 79 mL/min/{1.73_m2} (ref >59)
GLOBULIN, TOTAL: 2.8 g/dL (ref 1.5–4.5)
Glucose: 124 mg/dL — ABNORMAL HIGH (ref 65–99)
POTASSIUM: 3.9 mmol/L (ref 3.5–5.2)
SODIUM: 142 mmol/L (ref 134–144)
Total Protein: 7.1 g/dL (ref 6.0–8.5)

## 2017-10-29 LAB — CBC WITH DIFFERENTIAL/PLATELET
Basophils Absolute: 0 10*3/uL (ref 0.0–0.2)
Basos: 0 %
EOS (ABSOLUTE): 0.1 10*3/uL (ref 0.0–0.4)
EOS: 1 %
HEMATOCRIT: 41.4 % (ref 34.0–46.6)
Hemoglobin: 14.2 g/dL (ref 11.1–15.9)
IMMATURE GRANS (ABS): 0 10*3/uL (ref 0.0–0.1)
Immature Granulocytes: 0 %
LYMPHS: 20 %
Lymphocytes Absolute: 2.9 10*3/uL (ref 0.7–3.1)
MCH: 28.9 pg (ref 26.6–33.0)
MCHC: 34.3 g/dL (ref 31.5–35.7)
MCV: 84 fL (ref 79–97)
MONOCYTES: 8 %
Monocytes Absolute: 1.1 10*3/uL — ABNORMAL HIGH (ref 0.1–0.9)
NEUTROS ABS: 10.7 10*3/uL — AB (ref 1.4–7.0)
Neutrophils: 71 %
Platelets: 217 10*3/uL (ref 150–450)
RBC: 4.92 x10E6/uL (ref 3.77–5.28)
RDW: 14.5 % (ref 12.3–15.4)
WBC: 14.8 10*3/uL — ABNORMAL HIGH (ref 3.4–10.8)

## 2017-10-29 LAB — LIPID PANEL
CHOL/HDL RATIO: 5 ratio — AB (ref 0.0–4.4)
CHOLESTEROL TOTAL: 161 mg/dL (ref 100–199)
HDL: 32 mg/dL — ABNORMAL LOW (ref >39)
LDL CALC: 90 mg/dL (ref 0–99)
TRIGLYCERIDES: 196 mg/dL — AB (ref 0–149)
VLDL CHOLESTEROL CAL: 39 mg/dL (ref 5–40)

## 2017-10-30 LAB — URINALYSIS, COMPLETE
Bilirubin, UA: NEGATIVE
GLUCOSE, UA: NEGATIVE
Ketones, UA: NEGATIVE
LEUKOCYTES UA: NEGATIVE
Nitrite, UA: NEGATIVE
PROTEIN UA: NEGATIVE
RBC, UA: NEGATIVE
Specific Gravity, UA: 1.014 (ref 1.005–1.030)
Urobilinogen, Ur: 0.2 mg/dL (ref 0.2–1.0)
pH, UA: 5 (ref 5.0–7.5)

## 2017-10-30 LAB — URINE CULTURE: Organism ID, Bacteria: NO GROWTH

## 2017-10-30 LAB — MICROSCOPIC EXAMINATION
BACTERIA UA: NONE SEEN
Casts: NONE SEEN /lpf

## 2017-11-02 ENCOUNTER — Ambulatory Visit
Admission: RE | Admit: 2017-11-02 | Discharge: 2017-11-02 | Disposition: A | Discharge status: Home / Self Care | Payer: Managed Care, Other (non HMO) | Source: Ambulatory Visit | Attending: Nurse Practitioner | Admitting: Nurse Practitioner

## 2017-11-02 ENCOUNTER — Encounter: Payer: Self-pay | Admitting: Nurse Practitioner

## 2017-11-02 ENCOUNTER — Encounter: Payer: Self-pay | Admitting: Emergency Medicine

## 2017-11-02 ENCOUNTER — Ambulatory Visit: Payer: Managed Care, Other (non HMO) | Admitting: Nurse Practitioner

## 2017-11-02 VITALS — BP 124/82 | HR 110 | Temp 98.3°F | Resp 16 | Ht 64.0 in | Wt 217.7 lb

## 2017-11-02 DIAGNOSIS — R0989 Other specified symptoms and signs involving the circulatory and respiratory systems: Secondary | ICD-10-CM

## 2017-11-02 DIAGNOSIS — R0602 Shortness of breath: Secondary | ICD-10-CM

## 2017-11-02 DIAGNOSIS — R05 Cough: Secondary | ICD-10-CM | POA: Diagnosis not present

## 2017-11-02 DIAGNOSIS — R918 Other nonspecific abnormal finding of lung field: Secondary | ICD-10-CM | POA: Diagnosis not present

## 2017-11-02 DIAGNOSIS — R059 Cough, unspecified: Secondary | ICD-10-CM

## 2017-11-02 MED ORDER — DOXYCYCLINE HYCLATE 100 MG PO TABS: 100.0000 mg | ORAL_TABLET | Freq: Two times a day (BID) | ORAL | 0 refills | Status: DC

## 2017-11-02 NOTE — Patient Instructions (Addendum)
-   Please go across the street to get imaging so we can see if there is something new or concerning going on in your lungs as you are not improving - Stop taking amoxicillin and start taking this new antibiotic doxycycline - Congrat's on quitting smoking! We are so happy for you and so are your lungs! - If you're having chest pain, shortness of breath- worsening or unrelieved, feeling severe fatigue, palpitations of fevers and chills unrelieved with tylenol please go to the ER. At any point if you are not feeling able to care for yourself please call 911.  Shortness of Breath, Adult Shortness of breath means you have trouble breathing. Your lungs are organs for breathing. Follow these instructions at home: Pay attention to any changes in your symptoms. Take these actions to help with your condition:  Do not smoke. Smoking can cause shortness of breath. If you need help to quit smoking, ask your doctor.  Avoid things that can make it harder to breathe, such as: ? Mold. ? Dust. ? Air pollution. ? Chemical smells. ? Things that can cause allergy symptoms (allergens), if you have allergies.  Keep your living space clean and free of mold and dust.  Rest as needed. Slowly return to your usual activities.  Take over-the-counter and prescription medicines, including oxygen and inhaled medicines, only as told by your doctor.  Keep all follow-up visits as told by your doctor. This is important.  Contact a doctor if:  Your condition does not get better as soon as expected.  You have a hard time doing your normal activities, even after you rest.  You have new symptoms. Get help right away if:  You have trouble breathing when you are resting.  You feel light-headed or you faint.  You have a cough that is not helped by medicines.  You cough up blood.  You have pain with breathing.  You have pain in your chest, arms, shoulders, or belly (abdomen).  You have a fever.  You cannot walk  up stairs.  You cannot exercise the way you normally do. This information is not intended to replace advice given to you by your health care provider. Make sure you discuss any questions you have with your health care provider. Document Released: 10/22/2007 Document Revised: 05/22/2016 Document Reviewed: 05/22/2016 Elsevier Interactive Patient Education  2017 [employer].

## 2017-11-02 NOTE — Progress Notes (Addendum)
Name: Brittany Castro   MRN: [id number]    DOB: 1960/10/21   Date:11/02/2017       Progress Note  Subjective  Chief Complaint  Chief Complaint  Patient presents with  . Follow-up    lab work    HPI  Patient was seen in ED on 5/28 for left sided flank pain/back pain, and cough, congestion. Was seen in office by PCP- Dr. Sanda Klein on 6/12 and rx amoxicillin and given referral to pulm, PT, ortho and dietician for back pain, weight loss and chronic cough. CBC noted elevated WBC.  Patient states taking antibiotic three times a day- has been on it for 4-5 days with no missed doses states does not feel any better, but doesn't feel worse. Has been having coughing fits- constantly, states has thrown up her food due to coughing so much. Has to sleep elevated because she is unable to tolerate laying flat- because of coughing so much. Endorses subjective fevers, cough- productive phlegm, sore throat, headache when coughing, central chest pain when coughing- sts feels like there is a knot there.  Denies nausea, diarrhea, facial pain, ear pain, dysuria, dizziness. Patient quit smoking last week and has smoked since.   Patient Active Problem List   Diagnosis Date Noted  . Aortic atherosclerosis ([place]) 10/28/2017  . Interstitial lung disease ([place]) 10/28/2017  . Morbid obesity ([place]) 10/28/2017  . Degenerative disc disease, lumbar 10/28/2017  . Facet arthritis, degenerative, lumbar spine 10/28/2017  . Abdominal bloating 08/29/2016  . Redundant colon 08/29/2016  . Generalized abdominal pain 08/19/2016  . Fatty liver 08/19/2016  . Benign hypertension 08/19/2016  . Tobacco abuse 08/19/2016  . Right upper quadrant abdominal pain 07/03/2015  . Cold sore 05/22/2015  . Special screening for malignant neoplasms, colon   . Benign neoplasm of ascending colon   . Benign neoplasm of transverse colon   . Benign neoplasm of sigmoid colon   . Rectal polyp     Past Medical History:  Diagnosis Date  . Benign  hypertension 08/19/2016  . Fatty liver 08/19/2016   Moderate to severe on CT scan March 2018  . Hypertension   . Thyroid disease   . Tobacco abuse 08/19/2016    Past Surgical History:  Procedure Laterality Date  . carpel tunnel    . COLONOSCOPY WITH PROPOFOL N/A 11/16/2014   Procedure: COLONOSCOPY WITH PROPOFOL;  Surgeon: Lucilla Lame, MD;  Location: [facility] ENDOSCOPY;  Service: Endoscopy;  Laterality: N/A;  . HERNIA REPAIR  2002   Dr Bary Castilla  . TENDON RELEASE    . TUBAL LIGATION  2002    Social History   Tobacco Use  . Smoking status: Current Every Day Smoker    Packs/day: 0.25    Years: 20.00    Pack years: 5.00    Types: Cigarettes  . Smokeless tobacco: Never Used  Substance Use Topics  . Alcohol use: No     Current Outpatient Medications:  .  albuterol (PROVENTIL HFA;VENTOLIN HFA) 108 (90 Base) MCG/ACT inhaler, Inhale 2-4 puffs by mouth every 4 hours as needed for wheezing, cough, and/or shortness of breath, Disp: 1 Inhaler, Rfl: 1 .  benzonatate (TESSALON PERLES) 100 MG capsule, Take 1 capsule (100 mg total) by mouth 3 (three) times daily as needed., Disp: 30 capsule, Rfl: 0 .  fluticasone (FLONASE) 50 MCG/ACT nasal spray, Place 1 spray into both nostrils daily., Disp: 16 g, Rfl: 1 .  lidocaine (LIDODERM) 5 %, Place 1 patch onto the skin every 12 (twelve) hours.  Remove & Discard patch within 12 hours or as directed by MD.  Leave the patch off for 12 hours before applying a new one., Disp: 10 patch, Rfl: 0 .  traMADol (ULTRAM) 50 MG tablet, Take 1-2 tablets by mouth every 6 hours as needed for moderate to severe pain, Disp: 20 tablet, Rfl: 0 .  amoxicillin (AMOXIL) 250 MG/5ML suspension, Take 10 mLs (500 mg total) by mouth 3 (three) times daily. (Patient not taking: Reported on 11/02/2017), Disp: 300 mL, Rfl: 0 .  aspirin EC 81 MG tablet, Take 1 tablet (81 mg total) by mouth daily., Disp: , Rfl:  .  hydrochlorothiazide (MICROZIDE) 12.5 MG capsule, TAKE 1 CAPSULE (12.5 MG TOTAL) BY  MOUTH DAILY. (Patient not taking: Reported on 10/28/2017), Disp: 30 capsule, Rfl: 0  No Known Allergies  ROS   No other specific complaints in a complete review of systems (except as listed in HPI above).  Objective  Vitals:   11/02/17 1002  BP: 124/82  Pulse: (!) 110  Resp: 16  Temp: 98.3 F (36.8 C)  TempSrc: Oral  SpO2: 93%  Weight: 217 lb 11.2 oz (98.7 kg)  Height: 5\' 4"  (1.626 m)     Body mass index is 37.37 kg/m.  Nursing Note and Vital Signs reviewed.  Physical Exam  Constitutional: Patient appears well-developed and well-nourished. Obese  No distress.  HEENT: head atraumatic, normocephalic, pupils equal and reactive to light, TM's without erythema or bulging, no maxillary or frontal sinus tenderness , neck supple without lymphadenopathy, oropharynx red- raw, no swelling moist without exudate, no nasal discharge Cardiovascular: elevated rate, regular rhythm, S1/S2 present.  No murmur or rub heard. Pulses strong Pulmonary/Chest: Effort normal and breath sounds rhonchi throughout bilaterally No respiratory distress or retractions. Frequent coughing with large amounts of sputum Abdominal: Soft and non-tender, bowel sounds present, no CVA tenderness  Psychiatric: Patient has a normal mood and affect. behavior is normal. Judgment and thought content normal.  No results found for this or any previous visit (from the past 72 hour(s)).  Assessment & Plan  1. Shortness of breath  - DG Chest 2 View; Future - doxycycline (VIBRA-TABS) 100 MG tablet; Take 1 tablet (100 mg total) by mouth 2 (two) times daily.  Dispense: 20 tablet; Refill: 0  2. Rhonchi  - DG Chest 2 View; Future - doxycycline (VIBRA-TABS) 100 MG tablet; Take 1 tablet (100 mg total) by mouth 2 (two) times daily.  Dispense: 20 tablet; Refill: 0  3. Cough Musinex, tessalon pearls  - DG Chest 2 View; Future - doxycycline (VIBRA-TABS) 100 MG tablet; Take 1 tablet (100 mg total) by mouth 2 (two) times daily.   Dispense: 20 tablet; Refill: 0 - Respiratory or Resp and Sputum Culture  Discussed going to the ER vs outpatient management, sts will try new antibiotic and imaging outpatient first but go it unimproved. - Please go across the street to get imaging so we can see if there is something new or concerning going on in your lungs as you are not improving - Stop taking amoxicillin and start taking this new antibiotic doxycycline - Congrat's on quitting smoking! We are so happy for you and so are your lungs! - If you're having chest pain, shortness of breath- worsening or unrelieved, feeling severe fatigue, palpitations of fevers and chills unrelieved with tylenol please go to the ER. At any point if you are not feeling able to care for yourself please call 911.  Face-to-face time with patient was more than  25 minutes, >50% time spent counseling and coordination of care -Red flags and when to present for emergency care or RTC including fever >101.49F, chest pain, shortness of breath, new/worsening/un-resolving symptoms,  reviewed with patient at time of visit. Follow up and care instructions discussed and provided in AVS.  ----------------------------------------- I have reviewed this encounter including the documentation in this note and/or discussed this patient with the provider, Suezanne Cheshire DNP AGNP-C. I am certifying that I agree with the content of this note as supervising physician. Enid Derry, [facility] 11/03/2017, 5:22 PM

## 2017-11-03 DIAGNOSIS — M5416 Radiculopathy, lumbar region: Secondary | ICD-10-CM | POA: Insufficient documentation

## 2017-11-05 ENCOUNTER — Encounter: Payer: Self-pay | Admitting: Nurse Practitioner

## 2017-11-05 ENCOUNTER — Ambulatory Visit: Payer: Managed Care, Other (non HMO) | Admitting: Nurse Practitioner

## 2017-11-05 VITALS — BP 124/80 | HR 88 | Temp 98.4°F | Resp 16 | Ht 64.0 in | Wt 216.3 lb

## 2017-11-05 DIAGNOSIS — J849 Interstitial pulmonary disease, unspecified: Secondary | ICD-10-CM

## 2017-11-05 DIAGNOSIS — R05 Cough: Secondary | ICD-10-CM | POA: Diagnosis not present

## 2017-11-05 DIAGNOSIS — R059 Cough, unspecified: Secondary | ICD-10-CM

## 2017-11-05 LAB — RESPIRATORY CULTURE OR RESPIRATORY AND SPUTUM CULTURE
MICRO NUMBER:: 90722462
RESULT:: NORMAL
SPECIMEN QUALITY:: ADEQUATE

## 2017-11-05 MED ORDER — GUAIFENESIN 200 MG PO TABS: 200.0000 mg | ORAL_TABLET | Freq: Three times a day (TID) | ORAL | 0 refills | Status: DC | PRN

## 2017-11-05 MED ORDER — BENZONATATE 100 MG PO CAPS
100.0000 mg | ORAL_CAPSULE | Freq: Three times a day (TID) | ORAL | 0 refills | Status: DC | PRN
Start: 2017-11-05 — End: 2017-11-30

## 2017-11-05 NOTE — Patient Instructions (Addendum)
-   guaifenesin twice a day - continue antibiotic twice day with food until it is completely gone Please do eat yogurt daily or take a probiotic daily for the next month or two. We want to replace the healthy germs in the gut. If you notice foul, watery diarrhea in the next two months, schedule an appointment RIGHT AWAY - Take albuterol inhaler 1-2 puffs every 6 hours if having shortness of breath, or chest tightnesss.  - Keep follow up with pulmonologist

## 2017-11-05 NOTE — Progress Notes (Addendum)
Name: Brittany Castro   MRN: [id number]    DOB: 06-06-1960   Date:11/05/2017       Progress Note  Subjective  Chief Complaint  Chief Complaint  Patient presents with  . Follow-up    3 day recheck    HPI  States feels significantly better, able to get some sleep but still has to sleep in a recliner because its harder when laying flat. Patient has more energy, cough is much improved states sts sore throat and shortness of breath is mostly resolved. Denies chest pain, palpitations, sts she does not have to use albuterol inhaler but knows how to use it if she needs to. Pt still hasn't picked smoking back up- sts its hard but knows its best for her.   Discussed results of chest x-ray pt has pulmonology appointment on 11/12/2017  Patient Active Problem List   Diagnosis Date Noted  . Aortic atherosclerosis ([place]) 10/28/2017  . Interstitial lung disease ([place]) 10/28/2017  . Morbid obesity ([place]) 10/28/2017  . Degenerative disc disease, lumbar 10/28/2017  . Facet arthritis, degenerative, lumbar spine 10/28/2017  . Abdominal bloating 08/29/2016  . Redundant colon 08/29/2016  . Generalized abdominal pain 08/19/2016  . Fatty liver 08/19/2016  . Benign hypertension 08/19/2016  . Tobacco abuse 08/19/2016  . Right upper quadrant abdominal pain 07/03/2015  . Cold sore 05/22/2015  . Special screening for malignant neoplasms, colon   . Benign neoplasm of ascending colon   . Benign neoplasm of transverse colon   . Benign neoplasm of sigmoid colon   . Rectal polyp     Past Medical History:  Diagnosis Date  . Benign hypertension 08/19/2016  . Fatty liver 08/19/2016   Moderate to severe on CT scan March 2018  . Hypertension   . Thyroid disease   . Tobacco abuse 08/19/2016    Past Surgical History:  Procedure Laterality Date  . carpel tunnel    . COLONOSCOPY WITH PROPOFOL N/A 11/16/2014   Procedure: COLONOSCOPY WITH PROPOFOL;  Surgeon: Lucilla Lame, MD;  Location: [facility] ENDOSCOPY;  Service: Endoscopy;   Laterality: N/A;  . HERNIA REPAIR  2002   Dr Bary Castilla  . TENDON RELEASE    . TUBAL LIGATION  2002    Social History   Tobacco Use  . Smoking status: Former Smoker    Packs/day: 0.25    Years: 20.00    Pack years: 5.00    Types: Cigarettes    Last attempt to quit: 10/28/2017    Years since quitting: 0.0  . Smokeless tobacco: Never Used  Substance Use Topics  . Alcohol use: No     Current Outpatient Medications:  .  albuterol (PROVENTIL HFA;VENTOLIN HFA) 108 (90 Base) MCG/ACT inhaler, Inhale 2-4 puffs by mouth every 4 hours as needed for wheezing, cough, and/or shortness of breath, Disp: 1 Inhaler, Rfl: 1 .  aspirin EC 81 MG tablet, Take 1 tablet (81 mg total) by mouth daily., Disp: , Rfl:  .  benzonatate (TESSALON PERLES) 100 MG capsule, Take 1 capsule (100 mg total) by mouth 3 (three) times daily as needed., Disp: 30 capsule, Rfl: 0 .  doxycycline (VIBRA-TABS) 100 MG tablet, Take 1 tablet (100 mg total) by mouth 2 (two) times daily., Disp: 20 tablet, Rfl: 0 .  fluticasone (FLONASE) 50 MCG/ACT nasal spray, Place 1 spray into both nostrils daily., Disp: 16 g, Rfl: 1 .  lidocaine (LIDODERM) 5 %, Place 1 patch onto the skin every 12 (twelve) hours. Remove & Discard patch within 12  hours or as directed by MD.  [place] Proud the patch off for 12 hours before applying a new one., Disp: 10 patch, Rfl: 0 .  traMADol (ULTRAM) 50 MG tablet, Take 1-2 tablets by mouth every 6 hours as needed for moderate to severe pain, Disp: 20 tablet, Rfl: 0 .  amoxicillin (AMOXIL) 250 MG/5ML suspension, Take 10 mLs (500 mg total) by mouth 3 (three) times daily. (Patient not taking: Reported on 11/02/2017), Disp: 300 mL, Rfl: 0 .  guaiFENesin 200 MG tablet, Take 1 tablet (200 mg total) by mouth every 8 (eight) hours as needed for cough or to loosen phlegm., Disp: 30 tablet, Rfl: 0 .  hydrochlorothiazide (MICROZIDE) 12.5 MG capsule, TAKE 1 CAPSULE (12.5 MG TOTAL) BY MOUTH DAILY. (Patient not taking: Reported on  10/28/2017), Disp: 30 capsule, Rfl: 0  No Known Allergies  ROS  Constitutional: Negative for fever or weight change.  Respiratory: Positive for cough and mild shortness of breath.   Cardiovascular: Negative for chest pain or palpitations.  Gastrointestinal: Negative for abdominal pain, no bowel changes.  Musculoskeletal: Negative for gait problem or joint swelling.  Skin: Negative for rash.  Neurological: Negative for dizziness or headache.  No other specific complaints in a complete review of systems (except as listed in HPI above).  Objective  Vitals:   11/05/17 0855 11/05/17 0858  BP: 124/80   Pulse: (!) 102 88  Resp: 16   Temp: 98.4 F (36.9 C)   TempSrc: Oral   SpO2: 96%   Weight: 216 lb 4.8 oz (98.1 kg)   Height: 5\' 4"  (1.626 m)     Body mass index is 37.13 kg/m.  Nursing Note and Vital Signs reviewed.  Physical Exam  Constitutional: Patient appears well-developed and well-nourished. Obese. Patient appears much improved, smiling and sitting comfortably in chair  Cardiovascular: Normal rate, regular rhythm, S1/S2 present.    Pulmonary/Chest: Effort normal and breath sounds clear. Mild cough during questioning.  Psychiatric: Patient has a normal mood and affect. behavior is normal. Judgment and thought content normal.  Results for orders placed or performed in visit on 11/02/17 (from the past 72 hour(s))  Respiratory or Resp and Sputum Culture     Status: None (Preliminary result)   Collection Time: 11/02/17 11:25 AM  Result Value Ref Range   MICRO NUMBER: [id number]    SPECIMEN QUALITY: ADEQUATE    Source SPUTUM    STATUS: PRELIMINARY    GRAM STAIN:      Few Polymorphonuclear leukocytes Rare epithelial cells No organisms seen   RESULT: Growth of normal oropharyngeal flora.     Assessment & Plan  1. Cough - guaiFENesin 200 MG tablet; Take 1 tablet (200 mg total) by mouth every 8 (eight) hours as needed for cough or to loosen phlegm.  Dispense: 30 tablet;  Refill: 0 - benzonatate (TESSALON PERLES) 100 MG capsule; Take 1 capsule (100 mg total) by mouth 3 (three) times daily as needed.  Dispense: 30 capsule; Refill: 0  2. Interstitial lung disease ([place]) - Keep pulmonology appt next week and follow up with PCP  - guaiFENesin 200 MG tablet; Take 1 tablet (200 mg total) by mouth every 8 (eight) hours as needed for cough or to loosen phlegm.  Dispense: 30 tablet; Refill: 0 - benzonatate (TESSALON PERLES) 100 MG capsule; Take 1 capsule (100 mg total) by mouth 3 (three) times daily as needed.  Dispense: 30 capsule; Refill: 0     -Red flags and when to present for emergency care or  RTC including fever >101.11F, chest pain, shortness of breath, new/worsening/un-resolving symptoms,  reviewed with patient at time of visit. Follow up and care instructions discussed and provided in AVS. -Reviewed Health Maintenance: needs paps discussed with pt   ------------------------------------------- I have reviewed this encounter including the documentation in this note and/or discussed this patient with the provider, Suezanne Cheshire DNP AGNP-C. I am certifying that I agree with the content of this note as supervising physician. Enid Derry, [facility] 11/11/2017, 4:46 PM

## 2017-11-09 LAB — ALKALINE PHOSPHATASE, ISOENZYMES
Alkaline Phosphatase: 139 IU/L — ABNORMAL HIGH (ref 39–117)
BONE FRACTION: 36 % (ref 14–68)
INTESTINAL FRAC.: 4 % (ref 0–18)
LIVER FRACTION: 60 % (ref 18–85)

## 2017-11-09 LAB — SPECIMEN STATUS REPORT

## 2017-11-10 NOTE — Progress Notes (Deleted)
**  Chest x-ray 11/02/2017 >>bibasilar to mid zone interstitial changes with bronchiectasis greatest in the mid zones.  Interstitial changes not seen on lung cuts from CT abdomen pelvis on 5/28.  Images personally reviewed

## 2017-11-12 ENCOUNTER — Encounter: Payer: Self-pay | Admitting: Pulmonary Disease

## 2017-11-12 ENCOUNTER — Ambulatory Visit (INDEPENDENT_AMBULATORY_CARE_PROVIDER_SITE_OTHER): Payer: Managed Care, Other (non HMO) | Admitting: Pulmonary Disease

## 2017-11-12 ENCOUNTER — Institutional Professional Consult (permissible substitution): Payer: Managed Care, Other (non HMO) | Admitting: Internal Medicine

## 2017-11-12 VITALS — Resp 16 | Ht 64.0 in | Wt 219.0 lb

## 2017-11-12 DIAGNOSIS — R0609 Other forms of dyspnea: Secondary | ICD-10-CM | POA: Diagnosis not present

## 2017-11-12 DIAGNOSIS — J42 Unspecified chronic bronchitis: Secondary | ICD-10-CM

## 2017-11-12 DIAGNOSIS — E669 Obesity, unspecified: Secondary | ICD-10-CM

## 2017-11-12 DIAGNOSIS — F17200 Nicotine dependence, unspecified, uncomplicated: Secondary | ICD-10-CM

## 2017-11-12 DIAGNOSIS — E668 Other obesity: Secondary | ICD-10-CM

## 2017-11-12 NOTE — Patient Instructions (Signed)
We discussed smoking cessation in detail.  Continue albuterol inhaler as needed for increased shortness of breath, cough, wheezing, chest tightness  Lung function tests (PFTs) ordered  Follow-up in 4 to 6 weeks  I am not too worried about the findings on chest x-ray but we will need to repeat it in 3 to 6 months just to make sure

## 2017-11-16 ENCOUNTER — Encounter: Payer: Self-pay | Admitting: Pulmonary Disease

## 2017-11-16 NOTE — Progress Notes (Signed)
PULMONARY CONSULT NOTE  Requesting MD/Service: Lada Date of initial consultation: 11/12/17 Reason for consultation: Smoker, chronic bronchitis, DOE, abnormal CXR  PT PROFILE: 57 y.o. female smoker (1 PPD most of adult life, 3-4 cigs/d when initially evaluated) referred for evaluation of chronic bronchitis and exertional dyspnea  DATA:  INTERVAL:  HPI:  As above.  Per the patient, she has been "very sick" since the end of May.  She reports intermittent chronic cough productive of "mucus" for several years.  Her cough has been persistent since the end of May.  At that time the mucus volume was increased and mucus was discolored (green).  She has been on 2 courses of antibiotics with some improvement.  Her sputum remains mildly discolored but is not as purulent as previously.  She is never had hemoptysis.  She reports mild to moderate exertional dyspnea.  She has chronic back and hip pain due to degenerative disc disease.  Her back pain is more limiting than exertional dyspnea.  She denies CP, fever, hemoptysis, LE edema and calf tenderness.  Dr. Delight Ovens notes indicate that the compelling reason for referral to pulmonary medicine were findings on chest x-ray.  The x-ray from 617 does reveal interstitial prominence of both bases.  However, she also had a CT of the abdomen on 5/28 without findings of interstitial lung disease in the visualized portion of the lung bases.   Past Medical History:  Diagnosis Date  . Benign hypertension 08/19/2016  . Fatty liver 08/19/2016   Moderate to severe on CT scan March 2018  . Hypertension   . Thyroid disease   . Tobacco abuse 08/19/2016    Past Surgical History:  Procedure Laterality Date  . carpel tunnel    . COLONOSCOPY WITH PROPOFOL N/A 11/16/2014   Procedure: COLONOSCOPY WITH PROPOFOL;  Surgeon: Lucilla Lame, MD;  Location: [facility] ENDOSCOPY;  Service: Endoscopy;  Laterality: N/A;  . HERNIA REPAIR  2002   Dr Bary Castilla  . TENDON RELEASE    . TUBAL LIGATION   2002    MEDICATIONS: I have reviewed all medications and confirmed regimen as documented  Social History   Socioeconomic History  . Marital status: Divorced    Spouse name: Not on file  . Number of children: Not on file  . Years of education: Not on file  . Highest education level: Not on file  Occupational History  . Not on file  Social Needs  . Financial resource strain: Not on file  . Food insecurity:    Worry: Not on file    Inability: Not on file  . Transportation needs:    Medical: Not on file    Non-medical: Not on file  Tobacco Use  . Smoking status: Current Some Day Smoker    Packs/day: 0.25    Years: 20.00    Pack years: 5.00    Types: Cigarettes    Last attempt to quit: 10/28/2017    Years since quitting: 0.0  . Smokeless tobacco: Never Used  Substance and Sexual Activity  . Alcohol use: No  . Drug use: No  . Sexual activity: Not Currently  Lifestyle  . Physical activity:    Days per week: Not on file    Minutes per session: Not on file  . Stress: Not on file  Relationships  . Social connections:    Talks on phone: Not on file    Gets together: Not on file    Attends religious service: Not on file    Active  member of club or organization: Not on file    Attends meetings of clubs or organizations: Not on file    Relationship status: Not on file  . Intimate partner violence:    Fear of current or ex partner: Not on file    Emotionally abused: Not on file    Physically abused: Not on file    Forced sexual activity: Not on file  Other Topics Concern  . Not on file  Social History Narrative  . Not on file    Family History  Problem Relation Age of Onset  . Multiple sclerosis Mother   . Clotting disorder Mother   . Hyperlipidemia Mother   . Stroke Mother   . Alcohol abuse Father   . Cirrhosis Father   . Alcohol abuse Brother   . Cirrhosis Brother   . Heart attack Maternal Grandmother   . Diabetes Paternal Grandmother     ROS: No fever,  unexplained weight loss or weight gain No new focal weakness or sensory deficits No otalgia, hearing loss, visual changes, nasal and sinus symptoms, mouth and throat problems No neck pain or adenopathy No abdominal pain, N/V/D, diarrhea, change in bowel pattern No dysuria, change in urinary pattern   Vitals:   11/12/17 0830  Resp: 16  Weight: 219 lb (99.3 kg)  Height: 5\' 4"  (1.626 m)     EXAM:  Gen: WDWN, No overt respiratory distress HEENT: NCAT, sclera white, oropharynx normal Neck: Supple without LAN, thyromegaly, JVD Lungs: breath sounds mildly diminished, percussion normal, no wheezes or other adventitious sounds Cardiovascular: RRR, no murmurs noted Abdomen: Soft, nontender, normal BS Ext: without clubbing, cyanosis, edema Neuro: CNs grossly intact, motor and sensory intact Skin: Limited exam, no lesions noted  DATA:   BMP Latest Ref Rng & Units 10/28/2017 03/03/2017 08/14/2016  Glucose 65 - 99 mg/dL 124(H) 101(H) 116(H)  BUN 6 - 24 mg/dL 11 12 11   Creatinine 0.57 - 1.00 mg/dL 0.83 0.64 0.68  BUN/Creat Ratio 9 - 23 13 19  -  Sodium 134 - 144 mmol/L 142 141 138  Potassium 3.5 - 5.2 mmol/L 3.9 3.7 3.8  Chloride 96 - 106 mmol/L 104 103 105  CO2 20 - 29 mmol/L 23 22 26   Calcium 8.7 - 10.2 mg/dL 9.4 9.2 9.0    CBC Latest Ref Rng & Units 10/28/2017 03/03/2017 08/14/2016  WBC 3.4 - 10.8 x10E3/uL 14.8(H) 8.7 10.8  Hemoglobin 11.1 - 15.9 g/dL 14.2 14.1 14.6  Hematocrit 34.0 - 46.6 % 41.4 40.2 41.6  Platelets 150 - 450 x10E3/uL 217 206 214    CXR 6/17: Mild interstitial prominence in both bases  I have personally reviewed all chest radiographs reported above including CXRs and CT chest unless otherwise indicated  IMPRESSION:     ICD-10-CM   1. Smoker F17.200 Pulmonary Function Test [facility] Only  2. Chronic bronchitis, unspecified chronic bronchitis type ([place]) J42 Pulmonary Function Test [facility] Only  3. DOE (dyspnea on exertion) R06.09 Pulmonary Function Test [facility] Only  4.  Moderate obesity E66.8   5. Mild interstitial prominence in lung bases on CXR   PLAN:  We discussed smoking cessation in detail including strategies.  She is currently only smoking 3 or 4 cigarettes/day.  Therefore, I recommended an attempt at complete abstinence without medical therapy.  Continue albuterol inhaler as needed for increased shortness of breath, cough, wheezing, chest tightness  PFTs ordered  Follow-up in 4 to 6 weeks  I am not too worried about the findings on chest  x-ray but we will need to repeat it in 3 to 6 months just to make sure these findings regress or are stable    Merton Border, MD PCCM service Mobile [phone] Pager [phone] 11/16/2017 8:30 AM

## 2017-11-20 ENCOUNTER — Ambulatory Visit: Payer: Managed Care, Other (non HMO) | Admitting: Family Medicine

## 2017-11-27 ENCOUNTER — Encounter: Payer: Managed Care, Other (non HMO) | Attending: Family Medicine | Admitting: Dietician

## 2017-11-27 ENCOUNTER — Encounter: Payer: Self-pay | Admitting: Dietician

## 2017-11-27 VITALS — Ht 63.5 in | Wt 217.0 lb

## 2017-11-27 DIAGNOSIS — J849 Interstitial pulmonary disease, unspecified: Secondary | ICD-10-CM | POA: Diagnosis not present

## 2017-11-27 DIAGNOSIS — E669 Obesity, unspecified: Secondary | ICD-10-CM | POA: Diagnosis not present

## 2017-11-27 DIAGNOSIS — K760 Fatty (change of) liver, not elsewhere classified: Secondary | ICD-10-CM | POA: Insufficient documentation

## 2017-11-27 DIAGNOSIS — Z713 Dietary counseling and surveillance: Secondary | ICD-10-CM | POA: Diagnosis not present

## 2017-11-27 DIAGNOSIS — I10 Essential (primary) hypertension: Secondary | ICD-10-CM | POA: Insufficient documentation

## 2017-11-27 DIAGNOSIS — Z6837 Body mass index (BMI) 37.0-37.9, adult: Secondary | ICD-10-CM | POA: Diagnosis not present

## 2017-11-27 DIAGNOSIS — E782 Mixed hyperlipidemia: Secondary | ICD-10-CM

## 2017-11-27 DIAGNOSIS — I700 Atherosclerosis of aorta: Secondary | ICD-10-CM | POA: Diagnosis not present

## 2017-11-27 DIAGNOSIS — E6609 Other obesity due to excess calories: Secondary | ICD-10-CM

## 2017-11-27 NOTE — Patient Instructions (Addendum)
   Try honey, agave nectar, or stevia to sweeten tea and coffee instead of regular white sugar. Our goal is to reduce the number of sugar sweetened beverages you consume  Choose foods high in fiber (>4g/serving) to help better control blood sugars and reduce cholesterol levels  Fruits, veggies, whole grains, foods with the skin  Consider prepping vegetables and/or meals while you cannot sleep on your work "off" days  Try plain greek yogurt or non-fat sour cream in place of full- fat sour cream  For fatty liver, your diet should ideally be low in fat with a focus on heart - healthy "unsaturated" fats including omega-3s, high in fiber and high in antioxidants  Look for Bai drinks and other low calorie beverages

## 2017-11-27 NOTE — Progress Notes (Signed)
Medical Nutrition Therapy: Visit start time: 0845  end time: 0945  Assessment:  Diagnosis: NLFLD, Obesity, HLD Past medical history: HTN, Atherosclerosis Psychosocial issues/ stress concerns: none  Preferred learning method:  . Auditory  Current weight: 217#  Height: 5' 3.5" Medications, supplements: Aspirin, see chart  Progress and evaluation: (10/28/17) TG 196 H, HDL 32 L. Wt loss of 2# within the past two weeks. Patient describes her eating habits as "junk food junkie" who does not eat on a regular schedule. She works third shift and so typically consumes 1 larger meal at 3-4am and 1 smaller meal when she gets off work at [employer]. Due to her work schedule she c/o not being hungry often and is not able to sleep more than 2-3 hours at a time. She has started to make changes to her eating habits including reducing the number of sweets and sodas she consumes, eating less refined carbohydrates, trying vegetable-enriched noodles, and drinking more water. Now brings a cup to work to fill up with water. She has tried liquid stevia and splenda as sugar alternatives in her coffee in the past, but has not been consistent about choosing them. Reports to love vegetables but does not make time to prepare them on a regular basis.   Physical activity: None at this time  Dietary Intake:  Usual eating pattern includes 1 meals and 1 snacks per day. Dining out frequency: 6 meals per week.  Breakfast (6am): sandwich on white bread (chicken salad, deli ham, cheese), cereal (honey nut cheerios + whole milk but doesn't drink the milk because she doesn't like it) Snack: grapes, cherries, watermelon, popcorn Lunch: none Snack: none Supper (3-4am); main meal: hamburger, hot dog, spaghetti, pasta salad, chicken salad sandwich on croissant, watermelon Snack: none Beverages: 4-6 cups coffee with sugar per day, soda, water, hot tea with sugar  Nutrition Care Education: Topics covered: sugar sweetened beverage and other  sources of added sugar, identifying low fat and low sugar foods, low fat diet guidelines, sources of healthy fats, dietary sources of antioxidants and omega-3s, preparing foods in advance, foods high fiber Basic nutrition: basic food groups, appropriate nutrient balance, appropriate meal and snack schedule, general nutrition guidelines    Weight control: benefits of weight control (back pain, fatty liver disease), behavioral changes for weight loss Advanced nutrition: recipe modification, cooking techniques, food label reading Hyperlipidemia: healthy and unhealthy fats, role of fiber food sources of folate, Vitamin E, phytochemicals Other lifestyle changes: benefits of making changes, increasing motivation, readiness for change, identifying habits that need to change  Nutritional Diagnosis:  Warren-3.3 Overweight/obesity As related to lifestyle habits.  As evidenced by BMI 37.84, pt report of sleeping 2-3 hours at a time affecting appeite. NI-5.6.3 Inappropriate intake of fats (specify): saturated and trans As related to NAFLD, HLD.  As evidenced by patient report of diet high in processed foods, high-fat and fried meats, low in vegetables/ fiber.  Intervention: Discussion as noted above. Patient plans to continue to work on reducing the number of sodas she consumes daily, as well as looking for low/zero calorie beverage alternatives and trying Stevia in her coffee again rather than white sugar. She is also considering using the time she is awake on her off days to prepare meals / vegetables in advance. Overall she agrees to start choosing foods higher in fiber / lower in fat and sugar.  Education Materials given:  . General diet guidelines for Cholesterol-lowering/ Heart health . Antioxidants . Goals/ instructions  Learner/ who was taught:  .  Patient  Level of understanding: Marland Kitchen Verbalizes/ demonstrates competency  Demonstrated degree of understanding via:   Teach back Learning  barriers: . None  Willingness to learn/ readiness for change: . Acceptance, ready for change  Monitoring and Evaluation:  Dietary intake, exercise, lipid lab values, and body weight      follow up: prn

## 2017-11-30 ENCOUNTER — Encounter: Payer: Self-pay | Admitting: Family Medicine

## 2017-11-30 ENCOUNTER — Ambulatory Visit: Payer: Managed Care, Other (non HMO) | Admitting: Family Medicine

## 2017-11-30 DIAGNOSIS — K760 Fatty (change of) liver, not elsewhere classified: Secondary | ICD-10-CM | POA: Diagnosis not present

## 2017-11-30 DIAGNOSIS — J849 Interstitial pulmonary disease, unspecified: Secondary | ICD-10-CM | POA: Diagnosis not present

## 2017-11-30 DIAGNOSIS — M47816 Spondylosis without myelopathy or radiculopathy, lumbar region: Secondary | ICD-10-CM

## 2017-11-30 DIAGNOSIS — Z720 Tobacco use: Secondary | ICD-10-CM | POA: Diagnosis not present

## 2017-11-30 MED ORDER — NALTREXONE-BUPROPION HCL ER 8-90 MG PO TB12: ORAL_TABLET | ORAL | 0 refills | Status: DC

## 2017-11-30 NOTE — Patient Instructions (Addendum)
Start the Contrave Really be mindful on eating habits Check out the information at familydoctor.org entitled "Nutrition for Weight Loss: What You Need to Know about Fad Diets" Try to lose between 1-2 pounds per week by taking in fewer calories and burning off more calories You can succeed by limiting portions, limiting foods dense in calories and fat, becoming more active, and drinking 8 glasses of water a day (64 ounces) Don't skip meals, especially breakfast, as skipping meals may alter your metabolism Do not use over-the-counter weight loss pills or gimmicks that claim rapid weight loss A healthy BMI (or body mass index) is between 18.5 and 24.9 You can calculate your ideal BMI at the [place] website [employer]Mon[employer].fr Bupropion; Naltrexone extended-release tablets What is this medicine? BUPROPION; NALTREXONE (byoo PROE pee on; nal TREX one) is a combination product used to promote and maintain weight loss in obese adults or overweight adults who also have weight related medical problems. This medicine should be used with a reduced calorie diet and increased physical activity. This medicine may be used for other purposes; ask your health care provider or pharmacist if you have questions. COMMON BRAND NAME(S): CONTRAVE What should I tell my health care provider before I take this medicine? They need to know if you have any of these conditions: -an eating disorder, such as anorexia or bulimia -bipolar disorder -diabetes -depression -drug abuse or addiction -glaucoma -head injury -heart disease -high blood pressure -history of a tumor or infection of your brain or spine -history of stroke -history of irregular heartbeat -if you often drink alcohol -kidney disease -liver disease -schizophrenia -seizures -suicidal thoughts, plans, or attempt; a previous suicide attempt by you or a family member -an unusual or allergic reaction to bupropion,  naltrexone, other medicines, foods, dyes, or preservatives -breast-feeding -pregnant or trying to become pregnant How should I use this medicine? Take this medicine by mouth with a glass of water. Follow the directions on the prescription label. Take this medicine in the morning and in the evenings as directed by your healthcare professional. Dennis Bast can take it with or without food. Do not take with high-fat meals as this may increase your risk of seizures. Do not crush, chew, or cut these tablets. Do not take your medicine more often than directed. Do not stop taking this medicine suddenly except upon the advice of your doctor. A special MedGuide will be given to you by the pharmacist with each prescription and refill. Be sure to read this information carefully each time. Talk to your pediatrician regarding the use of this medicine in children. Special care may be needed. Overdosage: If you think you have taken too much of this medicine contact a poison control center or emergency room at once. NOTE: This medicine is only for you. Do not share this medicine with others. What if I miss a dose? If you miss a dose, skip the missed dose and take your next tablet at the regular time. Do not take double or extra doses. What may interact with this medicine? Do not take this medicine with any of the following medications: -any prescription or street opioid drug like codeine, heroin, methadone -linezolid -MAOIs like Carbex, Eldepryl, Marplan, Nardil, and Parnate -methylene blue (injected into a vein) -other medicines that contain bupropion like Zyban or Wellbutrin This medicine may also interact with the following medications: -alcohol -certain medicines for anxiety or sleep -certain medicines for blood pressure like metoprolol, propranolol -certain medicines for depression or psychotic disturbances -certain medicines for HIV  or AIDS like efavirenz, lopinavir, nelfinavir, ritonavir -certain medicines  for irregular heart beat like propafenone, flecainide -certain medicines for Parkinson's disease like amantadine, levodopa -certain medicines for seizures like carbamazepine, phenytoin, phenobarbital -cimetidine -clopidogrel -cyclophosphamide -digoxin -disulfiram -furazolidone -isoniazid -nicotine -orphenadrine -procarbazine -steroid medicines like prednisone or cortisone -stimulant medicines for attention disorders, weight loss, or to stay awake -tamoxifen -theophylline -thioridazine -thiotepa -ticlopidine -tramadol -warfarin This list may not describe all possible interactions. Give your health care provider a list of all the medicines, herbs, non-prescription drugs, or dietary supplements you use. Also tell them if you smoke, drink alcohol, or use illegal drugs. Some items may interact with your medicine. What should I watch for while using this medicine? This medicine is intended to be used in addition to a healthy diet and appropriate exercise. The best results are achieved this way. Do not increase or in any way change your dose without consulting your doctor or health care professional. Do not take this medicine with other prescription or over-the-counter weight loss products without consulting your doctor or health care professional. Your doctor should tell you to stop taking this medicine if you do not lose a certain amount of weight within the first 12 weeks of treatment. Visit your doctor or health care professional for regular checkups. Your doctor may order blood tests or other tests to see how you are doing. This medicine may affect blood sugar levels. If you have diabetes, check with your doctor or health care professional before you change your diet or the dose of your diabetic medicine. Patients and their families should watch out for new or worsening depression or thoughts of suicide. Also watch out for sudden changes in feelings such as feeling anxious, agitated,  panicky, irritable, hostile, aggressive, impulsive, severely restless, overly excited and hyperactive, or not being able to sleep. If this happens, especially at the beginning of treatment or after a change in dose, call your health care professional. Avoid alcoholic drinks while taking this medicine. Drinking large amounts of alcoholic beverages, using sleeping or anxiety medicines, or quickly stopping the use of these agents while taking this medicine may increase your risk for a seizure. What side effects may I notice from receiving this medicine? Side effects that you should report to your doctor or health care professional as soon as possible: -allergic reactions like skin rash, itching or hives, swelling of the face, lips, or tongue -breathing problems -changes in vision -confusion -elevated mood, decreased need for sleep, racing thoughts, impulsive behavior -fast or irregular heartbeat -hallucinations, loss of contact with reality -increased blood pressure -redness, blistering, peeling or loosening of the skin, including inside the mouth -seizures -signs and symptoms of liver injury like dark yellow or brown urine; general ill feeling or flu-like symptoms; light-colored stools; loss of appetite; nausea; right upper belly pain; unusually weak or tired; yellowing of the eyes or skin -suicidal thoughts or other mood changes -vomiting Side effects that usually do not require medical attention (report to your doctor or health care professional if they continue or are bothersome): -constipation -headache -loss of appetite -indigestion, stomach upset -tremors This list may not describe all possible side effects. Call your doctor for medical advice about side effects. You may report side effects to FDA at 1-800-FDA-1088. Where should I keep my medicine? Keep out of the reach of children. Store at room temperature between 15 and 30 degrees C (59 and 86 degrees F). Throw away any unused  medicine after the expiration date. NOTE: This sheet  is a summary. It may not cover all possible information. If you have questions about this medicine, talk to your doctor, pharmacist, or health care provider.  2018 Elsevier/Gold Standard (2015-10-26 13:42:58)  Obesity, Adult Obesity is the condition of having too much total body fat. Being overweight or obese means that your weight is greater than what is considered healthy for your body size. Obesity is determined by a measurement called BMI. BMI is an estimate of body fat and is calculated from height and weight. For adults, a BMI of 30 or higher is considered obese. Obesity can eventually lead to other health concerns and major illnesses, including:  Stroke.  Coronary artery disease (CAD).  Type 2 diabetes.  Some types of cancer, including cancers of the colon, breast, uterus, and gallbladder.  Osteoarthritis.  High blood pressure (hypertension).  High cholesterol.  Sleep apnea.  Gallbladder stones.  Infertility problems.  What are the causes? The main cause of obesity is taking in (consuming) more calories than your body uses for energy. Other factors that contribute to this condition may include:  Being born with genes that make you more likely to become obese.  Having a medical condition that causes obesity. These conditions include: ? Hypothyroidism. ? Polycystic ovarian syndrome (PCOS). ? Binge-eating disorder. ? Cushing syndrome.  Taking certain medicines, such as steroids, antidepressants, and seizure medicines.  Not being physically active (sedentary lifestyle).  Living where there are limited places to exercise safely or buy healthy foods.  Not getting enough sleep.  What increases the risk? The following factors may increase your risk of this condition:  Having a family history of obesity.  Being a woman of African-American descent.  Being a man of Hispanic descent.  What are the signs or  symptoms? Having excessive body fat is the main symptom of this condition. How is this diagnosed? This condition may be diagnosed based on:  Your symptoms.  Your medical history.  A physical exam. Your health care provider may measure: ? Your BMI. If you are an adult with a BMI between 25 and less than 30, you are considered overweight. If you are an adult with a BMI of 30 or higher, you are considered obese. ? The distances around your hips and your waist (circumferences). These may be compared to each other to help diagnose your condition. ? Your skinfold thickness. Your health care provider may gently pinch a fold of your skin and measure it.  How is this treated? Treatment for this condition often includes changing your lifestyle. Treatment may include some or all of the following:  Dietary changes. Work with your health care provider and a dietitian to set a weight-loss goal that is healthy and reasonable for you. Dietary changes may include eating: ? Smaller portions. A portion size is the amount of a particular food that is healthy for you to eat at one time. This varies from person to person. ? Low-calorie or low-fat options. ? More whole grains, fruits, and vegetables.  Regular physical activity. This may include aerobic activity (cardio) and strength training.  Medicine to help you lose weight. Your health care provider may prescribe medicine if you are unable to lose 1 pound a week after 6 weeks of eating more healthily and doing more physical activity.  Surgery. Surgical options may include gastric banding and gastric bypass. Surgery may be done if: ? Other treatments have not helped to improve your condition. ? You have a BMI of 40 or higher. ? You have  life-threatening health problems related to obesity.  Follow these instructions at home:  Eating and drinking   Follow recommendations from your health care provider about what you eat and drink. Your health care  provider may advise you to: ? Limit fast foods, sweets, and processed snack foods. ? Choose low-fat options, such as low-fat milk instead of whole milk. ? Eat 5 or more servings of fruits or vegetables every day. ? Eat at home more often. This gives you more control over what you eat. ? Choose healthy foods when you eat out. ? Learn what a healthy portion size is. ? Keep low-fat snacks on hand. ? Avoid sugary drinks, such as soda, fruit juice, iced tea sweetened with sugar, and flavored milk. ? Eat a healthy breakfast.  Drink enough water to keep your urine clear or pale yellow.  Do not go without eating for long periods of time (do not fast) or follow a fad diet. Fasting and fad diets can be unhealthy and even dangerous. Physical Activity  Exercise regularly, as told by your health care provider. Ask your health care provider what types of exercise are safe for you and how often you should exercise.  Warm up and stretch before being active.  Cool down and stretch after being active.  Rest between periods of activity. Lifestyle  Limit the time that you spend in front of your TV, computer, or video game system.  Find ways to reward yourself that do not involve food.  Limit alcohol intake to no more than 1 drink a day for nonpregnant women and 2 drinks a day for men. One drink equals 12 oz of beer, 5 oz of wine, or 1 oz of hard liquor. General instructions  Keep a weight loss journal to keep track of the food you eat and how much you exercise you get.  Take over-the-counter and prescription medicines only as told by your health care provider.  Take vitamins and supplements only as told by your health care provider.  Consider joining a support group. Your health care provider may be able to recommend a support group.  Keep all follow-up visits as told by your health care provider. This is important. Contact a health care provider if:  You are unable to meet your weight loss  goal after 6 weeks of dietary and lifestyle changes. This information is not intended to replace advice given to you by your health care provider. Make sure you discuss any questions you have with your health care provider. Document Released: 06/12/2004 Document Revised: 10/08/2015 Document Reviewed: 02/21/2015 Elsevier Interactive Patient Education  2018 [employer].

## 2017-11-30 NOTE — Assessment & Plan Note (Signed)
Weight loss key; prescribing Contrave to help with this

## 2017-11-30 NOTE — Assessment & Plan Note (Signed)
Seeing PT; weight loss should be helpful

## 2017-11-30 NOTE — Assessment & Plan Note (Signed)
Managed by pulmonologist; patient is actively working on smoking cessation; praise given

## 2017-11-30 NOTE — Assessment & Plan Note (Signed)
Proud of her efforts in really cutting down

## 2017-11-30 NOTE — Assessment & Plan Note (Signed)
BMI >35 plus HTN and fatty liver disease; discussed; rx for contrave; see AVS; f/u in 4 weeks; do not use any narcotic meds or cough syrups

## 2017-11-30 NOTE — Progress Notes (Signed)
BP 134/82   Pulse 96   Temp 98 F (36.7 C) (Oral)   Resp 14   Ht 5\' 4"  (1.626 m)   Wt 217 lb 14.4 oz (98.8 kg)   SpO2 99%   BMI 37.40 kg/m    Subjective:    Patient ID: Brittany Castro, female    DOB: [birth year], 57 y.o.   MRN: [id number]  HPI: Brittany Castro is a 57 y.o. female  Chief Complaint  Patient presents with  . Follow-up    HPI Patient is here for f/u  Last visit with me was 10/28/2017 Facet arthritis; filled out FMLA paperwork last visit and she was referral to PT; she has been a few times already  She also has obesity and we talked about that at her last visit; fatty liver goes along w/this; she was referred to medical nutrition therapy; she saw the nutritionist last week; talked for an hour last week; talked about good carbs and bad carbs; her sister is doing well on Contrave for weight loss; she is drinking enough water, all through the day, first thing getting up and before laying down; she does not do a lot of walking, "very little honestly", just back and forth at work; she would like to try the Contrave b/c her sister has lost 30-40 pounds; she would love to get down to 150 pounds; she weighed 111 pounds when she graduated high school  She has interstitial lung disease and has seen Dr. Alva Garnet, pulmonologist (visit 11/12/17); she is still smoking; she is going back to see him next month; she has really cut back on her smoking; he told her to not quit cold [place]; he told her to quit gradually; her triggers are after eating or riding down the road; she has eliminated the cigarette after eating; waiting two hours; her breathing is a lot better, but she still has a cough when laying down; still bringing up mucous, it's coming out now    Depression screen [facility] 2/9 11/27/2017 10/28/2017 08/29/2016 08/19/2016 05/22/2015  Decreased Interest 0 0 0 0 0  Down, Depressed, Hopeless 0 0 0 0 0  PHQ - 2 Score 0 0 0 0 0    Relevant past medical, surgical, family and social history  reviewed Past Medical History:  Diagnosis Date  . Benign hypertension 08/19/2016  . Fatty liver 08/19/2016   Moderate to severe on CT scan March 2018  . Hypertension   . Thyroid disease   . Tobacco abuse 08/19/2016   Past Surgical History:  Procedure Laterality Date  . carpel tunnel    . COLONOSCOPY WITH PROPOFOL N/A 11/16/2014   Procedure: COLONOSCOPY WITH PROPOFOL;  Surgeon: Lucilla Lame, MD;  Location: [facility] ENDOSCOPY;  Service: Endoscopy;  Laterality: N/A;  . HERNIA REPAIR  2002   Dr Bary Castilla  . TENDON RELEASE    . TUBAL LIGATION  2002   Family History  Problem Relation Age of Onset  . Multiple sclerosis Mother   . Clotting disorder Mother   . Hyperlipidemia Mother   . Stroke Mother   . Alcohol abuse Father   . Cirrhosis Father   . Alcohol abuse Brother   . Cirrhosis Brother   . Heart attack Maternal Grandmother   . Diabetes Paternal Grandmother    Social History   Tobacco Use  . Smoking status: Current Every Day Smoker    Packs/day: 0.25    Years: 20.00    Pack years: 5.00    Types: Cigarettes  Last attempt to quit: 10/28/2017    Years since quitting: 0.0  . Smokeless tobacco: Never Used  Substance Use Topics  . Alcohol use: No  . Drug use: No    Interim medical history since last visit reviewed. Allergies and medications reviewed  Review of Systems Per HPI unless specifically indicated above     Objective:    BP 134/82   Pulse 96   Temp 98 F (36.7 C) (Oral)   Resp 14   Ht 5\' 4"  (1.626 m)   Wt 217 lb 14.4 oz (98.8 kg)   SpO2 99%   BMI 37.40 kg/m   Wt Readings from Last 3 Encounters:  11/30/17 217 lb 14.4 oz (98.8 kg)  11/27/17 217 lb (98.4 kg)  11/12/17 219 lb (99.3 kg)    Physical Exam  Constitutional: She appears well-developed and well-nourished.  HENT:  Mouth/Throat: Mucous membranes are normal.  Eyes: EOM are normal. No scleral icterus.  Cardiovascular: Normal rate and regular rhythm.  Pulmonary/Chest: Effort normal and breath sounds  normal.  Psychiatric: She has a normal mood and affect. Her behavior is normal.    Results for orders placed or performed in visit on 11/02/17  Respiratory or Resp and Sputum Culture  Result Value Ref Range   MICRO NUMBER: [id number]    SPECIMEN QUALITY: ADEQUATE    Source SPUTUM    STATUS: FINAL    GRAM STAIN:      Few Polymorphonuclear leukocytes Rare epithelial cells No organisms seen   RESULT: Growth of normal oropharyngeal flora.       Assessment & Plan:   Problem List Items Addressed This Visit      Respiratory   Interstitial lung disease ([place])    Managed by pulmonologist; patient is actively working on smoking cessation; praise given        Digestive   Fatty liver    Weight loss key; prescribing Contrave to help with this        Musculoskeletal and Integument   Facet arthritis, degenerative, lumbar spine    Seeing PT; weight loss should be helpful        Other   Tobacco abuse    Proud of her efforts in really cutting down      Morbid obesity ([place])    BMI >35 plus HTN and fatty liver disease; discussed; rx for contrave; see AVS; f/u in 4 weeks; do not use any narcotic meds or cough syrups      Relevant Medications   Naltrexone-buPROPion HCl ER 8-90 MG TB12       Follow up plan: Return in about 1 month (around 12/28/2017) for follow-up visit with Dr. Sanda Klein.  An after-visit summary was printed and given to the patient at [place].  Please see the patient instructions which may contain other information and recommendations beyond what is mentioned above in the assessment and plan.  Meds ordered this encounter  Medications  . Naltrexone-buPROPion HCl ER 8-90 MG TB12    Sig: One by mouth daily x 1 week, then 1 pill BID x 1 week, then 2 pills in AM and 1 pill in PM x 1 week, then 2 pills BID    Dispense:  78 tablet    Refill:  0    No orders of the defined types were placed in this encounter.

## 2017-12-03 ENCOUNTER — Telehealth: Payer: Self-pay

## 2017-12-03 NOTE — Telephone Encounter (Signed)
Copied from Mobile [phone]. Topic: General - Other >> Dec 03, 2017 12:03 PM Yvette Rack wrote: Reason for CRM: Pt states she contacted the pharmacy regarding the Rx for the appetite suppressant. Pt request a call back. Cb# [phone]

## 2017-12-03 NOTE — Telephone Encounter (Signed)
Waiting on PA from insurance

## 2017-12-03 NOTE — Telephone Encounter (Signed)
I've already prescribed the medicine; see if you can help her

## 2017-12-18 NOTE — Telephone Encounter (Signed)
Patient called to state the number to contact  For authorization is [phone]. Please advise.

## 2017-12-18 NOTE — Telephone Encounter (Signed)
Approved.  

## 2017-12-29 ENCOUNTER — Ambulatory Visit: Payer: Managed Care, Other (non HMO)

## 2017-12-31 ENCOUNTER — Encounter: Payer: Self-pay | Admitting: Family Medicine

## 2017-12-31 ENCOUNTER — Ambulatory Visit: Payer: Managed Care, Other (non HMO) | Admitting: Family Medicine

## 2017-12-31 VITALS — BP 140/90 | HR 102 | Temp 98.7°F | Resp 16 | Ht 64.0 in | Wt 218.0 lb

## 2017-12-31 DIAGNOSIS — R000 Tachycardia, unspecified: Secondary | ICD-10-CM

## 2017-12-31 DIAGNOSIS — M5136 Other intervertebral disc degeneration, lumbar region: Secondary | ICD-10-CM

## 2017-12-31 DIAGNOSIS — J0100 Acute maxillary sinusitis, unspecified: Secondary | ICD-10-CM | POA: Diagnosis not present

## 2017-12-31 DIAGNOSIS — I10 Essential (primary) hypertension: Secondary | ICD-10-CM

## 2017-12-31 DIAGNOSIS — M47816 Spondylosis without myelopathy or radiculopathy, lumbar region: Secondary | ICD-10-CM

## 2017-12-31 MED ORDER — DOXYCYCLINE HYCLATE 100 MG PO TABS: 100.0000 mg | ORAL_TABLET | Freq: Two times a day (BID) | ORAL | 0 refills | Status: DC

## 2017-12-31 NOTE — Assessment & Plan Note (Addendum)
We'll work on this later, she says; she quit taking the Contrave when she got sick

## 2017-12-31 NOTE — Assessment & Plan Note (Signed)
Return in 2 weeks to see CMA to recheck BP; avoid decongestants and talk to pharmacist about what is safe while sick

## 2017-12-31 NOTE — Assessment & Plan Note (Signed)
Refer to back doctor

## 2017-12-31 NOTE — Patient Instructions (Addendum)
Start the antibiotics Please do eat yogurt or kimchi or take a probiotic daily for the next month We want to replace the healthy germs in the gut If you notice foul, watery diarrhea in the next two months, schedule an appointment RIGHT AWAY or go to an urgent care or the emergency room if a holiday or over a weekend  Talk to your pharmacist about what is safe to take with high blood pressure Try to use PLAIN allergy medicine without the decongestant Avoid: phenylephrine, phenylpropanolamine, and pseudoephredine  We'll have you see the back doctor  Return in 2 weeks to get your blood pressure rechecked  Try vitamin C (orange juice if not diabetic or vitamin C tablets) and drink green tea to help your immune system during your illness Get plenty of rest and hydration

## 2017-12-31 NOTE — Progress Notes (Signed)
BP 140/90 (BP Location: Left Arm, Patient Position: Sitting, Cuff Size: Normal)   Pulse (!) 102   Temp 98.7 F (37.1 C) (Oral)   Resp 16   Ht 5\' 4"  (1.626 m)   Wt 218 lb (98.9 kg)   SpO2 98%   BMI 37.42 kg/m    Subjective:    Patient ID: Brittany Castro, female    DOB: [birth year], 57 y.o.   MRN: [id number]  HPI: Brittany Castro is a 57 y.o. female  Chief Complaint  Patient presents with  . Back Pain    pain remains unresolved. She has to be careful about her gait or else she will have pain.    HPI  Patient is here for back pain, but feels sick; got sick on Monday (today is Thursday); went to [place] and Tues night, but last night just laid in bed; achy like the flu; coughing, but not bringing anything up; sinuses are draining; coughing so much though; coughing so hard that she threw up everything up; coughing hard that her back hurts; no travel  HTN; running high because she feels bad; using OTC cold and sinus medicine right now  Chronic low back pain; has been through PT and still doing the exercise  Depression screen [facility] 2/9 12/31/2017 11/27/2017 10/28/2017 08/29/2016 08/19/2016  Decreased Interest 0 0 0 0 0  Down, Depressed, Hopeless 0 0 0 0 0  PHQ - 2 Score 0 0 0 0 0    Relevant past medical, surgical, family and social history reviewed Past Medical History:  Diagnosis Date  . Benign hypertension 08/19/2016  . Fatty liver 08/19/2016   Moderate to severe on CT scan March 2018  . Hypertension   . Thyroid disease   . Tobacco abuse 08/19/2016   Past Surgical History:  Procedure Laterality Date  . carpel tunnel    . COLONOSCOPY WITH PROPOFOL N/A 11/16/2014   Procedure: COLONOSCOPY WITH PROPOFOL;  Surgeon: Lucilla Lame, MD;  Location: [facility] ENDOSCOPY;  Service: Endoscopy;  Laterality: N/A;  . HERNIA REPAIR  2002   Dr Bary Castilla  . TENDON RELEASE    . TUBAL LIGATION  2002   Family History  Problem Relation Age of Onset  . Multiple sclerosis Mother   . Clotting disorder Mother   .  Hyperlipidemia Mother   . Stroke Mother   . Alcohol abuse Father   . Cirrhosis Father   . Alcohol abuse Brother   . Cirrhosis Brother   . Heart attack Maternal Grandmother   . Diabetes Paternal Grandmother    Social History   Tobacco Use  . Smoking status: Current Every Day Smoker    Packs/day: 0.25    Years: 20.00    Pack years: 5.00    Types: Cigarettes    Last attempt to quit: 10/28/2017    Years since quitting: 0.1  . Smokeless tobacco: Never Used  Substance Use Topics  . Alcohol use: No  . Drug use: No    Interim medical history since last visit reviewed. Allergies and medications reviewed  Review of Systems Per HPI unless specifically indicated above     Objective:    BP 140/90 (BP Location: Left Arm, Patient Position: Sitting, Cuff Size: Normal)   Pulse (!) 102   Temp 98.7 F (37.1 C) (Oral)   Resp 16   Ht 5\' 4"  (1.626 m)   Wt 218 lb (98.9 kg)   SpO2 98%   BMI 37.42 kg/m   Wt Readings from Last  3 Encounters:  12/31/17 218 lb (98.9 kg)  11/30/17 217 lb 14.4 oz (98.8 kg)  11/27/17 217 lb (98.4 kg)    Physical Exam  Constitutional: She appears well-developed and well-nourished. No distress.  Morbidly obese  HENT:  Head: Normocephalic and atraumatic.  Nose: Mucosal edema and rhinorrhea (cloudy) present. Right sinus exhibits no maxillary sinus tenderness and no frontal sinus tenderness. Left sinus exhibits no maxillary sinus tenderness and no frontal sinus tenderness.  Cerumen impaction bilaterally  Eyes: EOM are normal. No scleral icterus.  Neck: No thyromegaly present.  Cardiovascular: Normal rate, regular rhythm and normal heart sounds.  No murmur heard. Pulmonary/Chest: Effort normal. No respiratory distress. She has decreased breath sounds (LEFT posterior lower area). She has no wheezes.  Abdominal: Soft. Bowel sounds are normal. She exhibits no distension.  Musculoskeletal: She exhibits no edema.       Lumbar back: She exhibits tenderness.        Back:  Neurological: She is alert.  Reflex Scores:      Patellar reflexes are 2+ on the right side and 2+ on the left side. LE strength 5/5  Skin: Skin is warm and dry. She is not diaphoretic. No pallor.  Very warm to the touch; no diaphoresis  Psychiatric: She has a normal mood and affect. Her behavior is normal. Judgment and thought content normal.    Results for orders placed or performed in visit on 11/02/17  Respiratory or Resp and Sputum Culture  Result Value Ref Range   MICRO NUMBER: [id number]    SPECIMEN QUALITY: ADEQUATE    Source SPUTUM    STATUS: FINAL    GRAM STAIN:      Few Polymorphonuclear leukocytes Rare epithelial cells No organisms seen   RESULT: Growth of normal oropharyngeal flora.       Assessment & Plan:   Problem List Items Addressed This Visit      Cardiovascular and Mediastinum   Benign hypertension    Return in 2 weeks to see CMA to recheck BP; avoid decongestants and talk to pharmacist about what is safe while sick        Musculoskeletal and Integument   Facet arthritis, degenerative, lumbar spine    Refer to back doctor      Relevant Orders   Ambulatory referral to Orthopedic Surgery   Degenerative disc disease, lumbar    Refer to back doctor      Relevant Orders   Ambulatory referral to Orthopedic Surgery     Other   Morbid obesity ([place])    We'll work on this later, she says; she quit taking the Contrave when she got sick       Other Visit Diagnoses    Acute non-recurrent maxillary sinusitis    -  Primary   Relevant Medications   doxycycline (VIBRA-TABS) 100 MG tablet   Tachycardia       encouraged hydration and rest; avoid decongestants       Follow up plan: Return in about 2 weeks (around 01/14/2018) for blood pressure recheck with CMA.  An after-visit summary was printed and given to the patient at [place].  Please see the patient instructions which may contain other information and recommendations beyond what is  mentioned above in the assessment and plan.  Meds ordered this encounter  Medications  . doxycycline (VIBRA-TABS) 100 MG tablet    Sig: Take 1 tablet (100 mg total) by mouth 2 (two) times daily.    Dispense:  20 tablet  Refill:  0    Orders Placed This Encounter  Procedures  . Ambulatory referral to Orthopedic Surgery

## 2018-01-04 ENCOUNTER — Ambulatory Visit: Payer: Managed Care, Other (non HMO) | Admitting: Pulmonary Disease

## 2018-01-14 ENCOUNTER — Ambulatory Visit: Payer: Managed Care, Other (non HMO)

## 2018-01-16 ENCOUNTER — Other Ambulatory Visit: Payer: Self-pay | Admitting: Family Medicine

## 2018-01-19 NOTE — Telephone Encounter (Signed)
Last OV 12/31/17

## 2018-02-12 ENCOUNTER — Ambulatory Visit
Admission: RE | Admit: 2018-02-12 | Discharge: 2018-02-12 | Disposition: A | Discharge status: Home / Self Care | Payer: Managed Care, Other (non HMO) | Source: Ambulatory Visit | Attending: Family Medicine | Admitting: Family Medicine

## 2018-02-12 ENCOUNTER — Encounter: Payer: Self-pay | Admitting: Family Medicine

## 2018-02-12 ENCOUNTER — Ambulatory Visit: Payer: Managed Care, Other (non HMO) | Admitting: Family Medicine

## 2018-02-12 ENCOUNTER — Other Ambulatory Visit: Payer: Self-pay | Admitting: Family Medicine

## 2018-02-12 VITALS — BP 132/68 | HR 96 | Temp 98.1°F | Ht 63.0 in | Wt 214.8 lb

## 2018-02-12 DIAGNOSIS — M25511 Pain in right shoulder: Secondary | ICD-10-CM

## 2018-02-12 DIAGNOSIS — M542 Cervicalgia: Secondary | ICD-10-CM

## 2018-02-12 DIAGNOSIS — M5136 Other intervertebral disc degeneration, lumbar region: Secondary | ICD-10-CM

## 2018-02-12 DIAGNOSIS — M47816 Spondylosis without myelopathy or radiculopathy, lumbar region: Secondary | ICD-10-CM

## 2018-02-12 DIAGNOSIS — J849 Interstitial pulmonary disease, unspecified: Secondary | ICD-10-CM

## 2018-02-12 DIAGNOSIS — M79672 Pain in left foot: Secondary | ICD-10-CM | POA: Diagnosis not present

## 2018-02-12 MED ORDER — PREDNISONE 20 MG PO TABS: 40.0000 mg | ORAL_TABLET | Freq: Every day | ORAL | 0 refills | Status: DC

## 2018-02-12 MED ORDER — TIZANIDINE HCL 4 MG PO TABS: 4.0000 mg | ORAL_TABLET | Freq: Three times a day (TID) | ORAL | 0 refills | Status: DC | PRN

## 2018-02-12 NOTE — Progress Notes (Signed)
BP 132/68   Pulse 96   Temp 98.1 F (36.7 C)   Ht 5\' 3"  (1.6 m)   Wt 214 lb 12.8 oz (97.4 kg)   SpO2 98%   BMI 38.05 kg/m    Subjective:    Patient ID: Brittany Castro, female    DOB: [birth year], 57 y.o.   MRN: [id number]  HPI: Brittany Castro is a 57 y.o. female  Chief Complaint  Patient presents with  . Back Pain    low has arhtritis in back  . Shoulder Pain    right  . Medication Refill  . Foot Pain    left if she steps wrong, sharp pain    HPI Patient is here for several issues, most acute  She has known facet arthritis; last seen in July for this; she says that it is mostly on her right side; going on for months; back pain was a 6 out of 10 on arrival; now that she's still, maybe a 5 out of 10  Now it's in her shoulder; if she lays on the right side, it hurts even more; feels tight, moving neck wrong and it will go up the side of her neck; cannot tramadol; neck and shoulder pain started Sunday; she was supposed to go to work Monday night because of her pain; she called Wednesday for an appointment Right shoulder right now is a 6 out of 10 ; it goes up to at least a 9 or 10 out of 10 Neck pain right now is a 5 out of 10 when still; when moving and working, it goes up to an 8 or 9 out of 10   CT scan May 2019: Musculoskeletal: Lower lumbar degenerative disc disease and degenerative facet disease.  Her mother has a disease called myositis; if she does not place her left foot down and really watch how she puts her foot down, then she might fall; mother was the same way; mother has had it for 20+ years; patient aches in her muscles; muscles do not really feel hot or warm  ILD and is seeing Dr. 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, pushes the cart to the back and pulls specimen; lots of bending and lifting; 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; she cannot walk a long way; she used to go shopping and cannot go to that now; she has FMLA and can be out for up to 7 days  Depression screen [facility] 2/9 02/12/2018 12/31/2017 11/27/2017 10/28/2017 08/29/2016  Decreased Interest 0 0 0 0 0  Down, Depressed, Hopeless 0 0 0 0 0  PHQ - 2 Score 0 0 0 0 0  Altered sleeping 0 - - - -  Tired, decreased energy 0 - - - -  Change in appetite 0 - - - -  Feeling bad or failure about yourself  0 - - - -  Trouble concentrating 0 - - - -  Moving  slowly or fidgety/restless 0 - - - -  Suicidal thoughts 0 - - - -  PHQ-9 Score 0 - - - -  Difficult doing work/chores Not difficult at all - - - -   Fall Risk  02/12/2018 12/31/2017 11/27/2017 10/28/2017 08/29/2016  Falls in the past year? No No No No No    Relevant past medical, surgical, family and social history reviewed Past Medical History:  Diagnosis Date  . Benign hypertension 08/19/2016  . Fatty liver 08/19/2016   Moderate to severe on CT scan March 2018  . Hypertension   . Thyroid disease   . Tobacco abuse 08/19/2016   Past Surgical History:  Procedure Laterality Date  . carpel tunnel    . COLONOSCOPY WITH PROPOFOL N/A 11/16/2014   Procedure: COLONOSCOPY WITH PROPOFOL;  Surgeon: Lucilla Lame, MD;  Location: [facility] ENDOSCOPY;  Service: Endoscopy;  Laterality: N/A;  . HERNIA REPAIR  2002   Dr Bary Castilla  . TENDON RELEASE    . TUBAL LIGATION  2002   Family History  Problem Relation Age of Onset  . Multiple sclerosis Mother   . Clotting disorder Mother   . Hyperlipidemia Mother   . Stroke Mother   . Alcohol abuse Father   . Cirrhosis Father   .  Alcohol abuse Brother   . Cirrhosis Brother   . Heart attack Maternal Grandmother   . Diabetes Paternal Grandmother    Social History   Tobacco Use  . Smoking status: Current Every Day Smoker    Packs/day: 0.25    Years: 20.00    Pack years: 5.00    Types: Cigarettes    Last attempt to quit: 10/28/2017    Years since quitting: 0.3  . Smokeless tobacco: Never Used  Substance Use Topics  . Alcohol use: No  . Drug use: No     [facility] Visit from 02/12/2018 in [facility]  AUDIT-C Score  1      Interim medical history since last visit reviewed. Allergies and medications reviewed  Review of Systems Per HPI unless specifically indicated above     Objective:    BP 132/68   Pulse 96   Temp 98.1 F (36.7 C)   Ht 5\' 3"  (1.6 m)   Wt 214 lb 12.8 oz (97.4 kg)   SpO2 98%   BMI 38.05 kg/m   Wt Readings from Last 3 Encounters:  02/12/18 214 lb 12.8 oz (97.4 kg)  12/31/17 218 lb (98.9 kg)  11/30/17 217 lb 14.4 oz (98.8 kg)    Physical Exam  Constitutional: She appears well-developed and well-nourished. No distress.  HENT:  Head: Normocephalic and atraumatic.  Eyes: EOM are normal. No scleral icterus.  Neck: No thyromegaly present.  Cardiovascular: Normal rate, regular rhythm and normal heart sounds.  No murmur heard. Pulmonary/Chest: Effort normal and breath sounds normal. No respiratory distress. She has no wheezes.  Abdominal: Soft. Bowel sounds are normal. She exhibits no distension.  Musculoskeletal: She exhibits no edema.       Right shoulder: She exhibits decreased range of motion. She exhibits no deformity.       Cervical back: She exhibits decreased range of motion and tenderness.       Back:  Neurological: She is alert.  Skin: Skin is warm and dry. She is not diaphoretic. No pallor.  Psychiatric: She has a normal mood and affect. Her behavior is normal. Judgment and thought content normal.    Results for orders placed or performed in  visit  on 11/02/17  Respiratory or Resp and Sputum Culture  Result Value Ref Range   MICRO NUMBER: [id number]    SPECIMEN QUALITY: ADEQUATE    Source SPUTUM    STATUS: FINAL    GRAM STAIN:      Few Polymorphonuclear leukocytes Rare epithelial cells No organisms seen   RESULT: Growth of normal oropharyngeal flora.       Assessment & Plan:   Problem List Items Addressed This Visit      Respiratory   Interstitial lung disease ([place])    Managed by pulmonologist; I sent him request for refill of her SABA        Musculoskeletal and Integument   Facet arthritis, degenerative, lumbar spine    Ongoing issue      Relevant Medications   predniSONE (DELTASONE) 20 MG tablet   tiZANidine (ZANAFLEX) 4 MG tablet   Other Relevant Orders   Ambulatory referral to Rheumatology   Ambulatory referral to Orthopedic Surgery   Degenerative disc disease, lumbar    Ongoing issue      Relevant Medications   predniSONE (DELTASONE) 20 MG tablet   tiZANidine (ZANAFLEX) 4 MG tablet   Other Relevant Orders   Ambulatory referral to Rheumatology   Ambulatory referral to Orthopedic Surgery    Other Visit Diagnoses    Acute pain of right shoulder    -  Primary   ddx discussed, arthritis, radiculopathy, will start with xrays; she is not able to perform her job at this time; more than 3 days of absence so FMLA   Relevant Orders   DG Shoulder Right (Completed)   Ambulatory referral to Rheumatology   Ambulatory referral to Orthopedic Surgery   Ambulatory referral to Physical Therapy   Neck pain, acute       ddx discussed; will start wiht neck plain films, may need MRI; refer to ortho; new FMLA for new problem, cannot perform her usual duties, significant pain   Relevant Orders   DG Cervical Spine Complete (Completed)   Ambulatory referral to Rheumatology   Ambulatory referral to Orthopedic Surgery   Ambulatory referral to Physical Therapy   Foot pain, left       patient concerned, will refer to specialist     Relevant Orders   Ambulatory referral to Rheumatology      Follow up plan: No follow-ups on file.  An after-visit summary was printed and given to the patient at [place].  Please see the patient instructions which may contain other information and recommendations beyond what is mentioned above in the assessment and plan.  Meds ordered this encounter  Medications  . predniSONE (DELTASONE) 20 MG tablet    Sig: Take 2 tablets (40 mg total) by mouth daily. With meal    Dispense:  10 tablet    Refill:  0  . tiZANidine (ZANAFLEX) 4 MG tablet    Sig: Take 1 tablet (4 mg total) by mouth every 8 (eight) hours as needed for muscle spasms.    Dispense:  30 tablet    Refill:  0    Orders Placed This Encounter  Procedures  . DG Shoulder Right  . DG Cervical Spine Complete  . Ambulatory referral to Rheumatology  . Ambulatory referral to Orthopedic Surgery  . Ambulatory referral to Physical Therapy    Addendum: I realized after she left that this qualifies as new FMLA so I can extend her work note; will plan for Monday Sept 23rd through October 2nd, may return on  October 3rd pending results of tests, referral appointemnts, patient's response to medicine;therapy

## 2018-02-12 NOTE — Patient Instructions (Addendum)
I understand about being out this week; but any further extension will need to be approved by orthopaedist or call me We'll have you see them If we can't get you in soon enough, go to [facility] on Monday  Okay to take turmeric Try turmeric as a natural anti-inflammatory (for pain and arthritis). It comes in capsules where you buy aspirin and fish oil, but also as a spice where you buy pepper and garlic powder. Okay to take plain Tylenol per package direction Start the prednisone Start the tizanidine (do not drive if it makes you goofy or sleepy)

## 2018-02-13 MED ORDER — ALBUTEROL SULFATE HFA 108 (90 BASE) MCG/ACT IN AERS
INHALATION_SPRAY | RESPIRATORY_TRACT | 1 refills | Status: DC
Start: 2018-02-13 — End: 2023-03-23

## 2018-02-15 ENCOUNTER — Encounter: Payer: Self-pay | Admitting: Family Medicine

## 2018-02-15 DIAGNOSIS — M19019 Primary osteoarthritis, unspecified shoulder: Secondary | ICD-10-CM | POA: Insufficient documentation

## 2018-02-15 NOTE — Assessment & Plan Note (Signed)
Ongoing issue 

## 2018-02-15 NOTE — Assessment & Plan Note (Signed)
Managed by pulmonologist; I sent him request for refill of her SABA

## 2018-02-16 DIAGNOSIS — S86919A Strain of unspecified muscle(s) and tendon(s) at lower leg level, unspecified leg, initial encounter: Secondary | ICD-10-CM | POA: Insufficient documentation

## 2018-02-16 DIAGNOSIS — M76829 Posterior tibial tendinitis, unspecified leg: Secondary | ICD-10-CM | POA: Insufficient documentation

## 2018-02-17 DIAGNOSIS — M47816 Spondylosis without myelopathy or radiculopathy, lumbar region: Secondary | ICD-10-CM | POA: Insufficient documentation

## 2018-02-17 DIAGNOSIS — M7551 Bursitis of right shoulder: Secondary | ICD-10-CM | POA: Insufficient documentation

## 2018-02-17 DIAGNOSIS — M25572 Pain in left ankle and joints of left foot: Secondary | ICD-10-CM | POA: Insufficient documentation

## 2018-02-17 DIAGNOSIS — M2550 Pain in unspecified joint: Secondary | ICD-10-CM | POA: Insufficient documentation

## 2018-02-18 ENCOUNTER — Telehealth: Payer: Self-pay

## 2018-02-18 NOTE — Telephone Encounter (Signed)
Copied from [place] [id number]. Topic: Quick Communication - See Telephone Encounter >> Feb 17, 2018  3:00 PM Antonieta Iba C wrote: CRM for notification. See Telephone encounter for: 02/17/18.  [employer] called in to follow up on a disability form that was faxed in on Monday. Please provide a status update.   CB: [phone]

## 2018-02-18 NOTE — Telephone Encounter (Signed)
Have you seen or filled out any paperwork for this patient? I do not recall seeing anything and do not see anything in media tab?

## 2018-02-19 ENCOUNTER — Encounter: Payer: Self-pay | Admitting: Family Medicine

## 2018-02-19 ENCOUNTER — Ambulatory Visit: Payer: Managed Care, Other (non HMO) | Admitting: Family Medicine

## 2018-02-19 VITALS — BP 122/70 | HR 99 | Temp 98.2°F | Ht 63.0 in | Wt 216.7 lb

## 2018-02-19 DIAGNOSIS — M7551 Bursitis of right shoulder: Secondary | ICD-10-CM

## 2018-02-19 DIAGNOSIS — M5412 Radiculopathy, cervical region: Secondary | ICD-10-CM

## 2018-02-19 NOTE — Progress Notes (Signed)
BP 122/70   Pulse 99   Temp 98.2 F (36.8 C)   Ht 5\' 3"  (1.6 m)   Wt 216 lb 11.2 oz (98.3 kg)   SpO2 98%   BMI 38.39 kg/m    Subjective:    Patient ID: Brittany Castro, female    DOB: February 28, 1961, 58 y.o.   MRN: [id number]  HPI: Brittany Castro is a 57 y.o. female  Chief Complaint  Patient presents with  . Follow-up    FMLA Paperwork    HPI Patient is here for FMLA paperwork She is having her daughter fax the [facility] paperwork.   States she has been out of work since September 20th (?). States has lower back pain. States started having neck pain and shoulder pain, was seen here on 9/27 and had x-rays showing pinched nerve in neck and right shoulder arthritis.  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  Not back to work yet; see below; will be starting physical therapy; she will be doing therapy, just had the cortisone shot  Saw Dr. Meda Coffee on Wednesday 02/17/2018- given cortisone shot in shoulder and referred to physical therapy. Also given steroid taper- hasn't started; plans to pick up today. Some relief from cortisone shot- initially was unable to move shoulder much prior now can get up about 45 degrees. Only able to pick things up with left hand. Can move neck up and down but difficulty turning head to the right.    Depression screen [facility] 2/9 02/19/2018 02/12/2018 12/31/2017 11/27/2017 10/28/2017  Decreased Interest 0 0 0 0 0  Down, Depressed, Hopeless 0 0 0 0 0  PHQ - 2 Score 0 0 0 0 0  Altered sleeping 3 0 - - -  Tired, decreased energy 1 0 - - -  Change in appetite 0 0 - - -  Feeling bad or failure about yourself  0 0 - - -  Trouble concentrating 0 0 - - -  Moving slowly or fidgety/restless 0 0 - - -  Suicidal thoughts 0 0 - - -  PHQ-9 Score 4 0 - - -  Difficult doing work/chores - Not difficult at all - - -   Fall Risk  02/19/2018 02/12/2018  12/31/2017 11/27/2017 10/28/2017  Falls in the past year? No No No No No    Relevant past medical, surgical, family and social history reviewed Past Medical History:  Diagnosis Date  . Benign hypertension 08/19/2016  . Fatty liver 08/19/2016   Moderate to severe on CT scan March 2018  . Hypertension   . Thyroid disease   . Tobacco abuse 08/19/2016   Past Surgical History:  Procedure Laterality Date  . carpel tunnel    . COLONOSCOPY WITH PROPOFOL N/A 11/16/2014   Procedure: COLONOSCOPY WITH PROPOFOL;  Surgeon: Lucilla Lame, MD;  Location: [facility] ENDOSCOPY;  Service: Endoscopy;  Laterality: N/A;  . HERNIA REPAIR  2002   Dr Bary Castilla  . TENDON RELEASE    . TUBAL LIGATION  2002   Family History  Problem Relation Age of Onset  . Multiple sclerosis Mother   . Clotting disorder Mother   . Hyperlipidemia Mother   . Stroke Mother   . Alcohol abuse Father   . Cirrhosis Father   . Alcohol abuse Brother   . Cirrhosis Brother   . Heart attack Maternal Grandmother   .  Diabetes Paternal Grandmother    Social History   Tobacco Use  . Smoking status: Current Every Day Smoker    Packs/day: 0.25    Years: 20.00    Pack years: 5.00    Types: Cigarettes    Last attempt to quit: 10/28/2017    Years since quitting: 0.3  . Smokeless tobacco: Never Used  Substance Use Topics  . Alcohol use: No  . Drug use: No     Office Visit from 02/19/2018 in [facility]  AUDIT-C Score  1      Interim medical history since last visit reviewed. Allergies and medications reviewed  Review of Systems Per HPI unless specifically indicated above     Objective:    BP 122/70   Pulse 99   Temp 98.2 F (36.8 C)   Ht 5\' 3"  (1.6 m)   Wt 216 lb 11.2 oz (98.3 kg)   SpO2 98%   BMI 38.39 kg/m   Wt Readings from Last 3 Encounters:  02/19/18 216 lb 11.2 oz (98.3 kg)  02/12/18 214 lb 12.8 oz (97.4 kg)  12/31/17 218 lb (98.9 kg)    Physical Exam  Constitutional: She appears well-developed and  well-nourished.  Cardiovascular: Normal rate.  Pulmonary/Chest: Effort normal.  Musculoskeletal:       Right shoulder: She exhibits decreased range of motion and tenderness. She exhibits no deformity.  Neurological:  Strength is good  Skin: No pallor.  Psychiatric: She has a normal mood and affect.    Results for orders placed or performed in visit on 11/02/17  Respiratory or Resp and Sputum Culture  Result Value Ref Range   MICRO NUMBER: [id number]    SPECIMEN QUALITY: ADEQUATE    Source SPUTUM    STATUS: FINAL    GRAM STAIN:      Few Polymorphonuclear leukocytes Rare epithelial cells No organisms seen   RESULT: Growth of normal oropharyngeal flora.       Assessment & Plan:   Problem List Items Addressed This Visit    None    Visit Diagnoses    Cervical radiculopathy    -  Primary   patient seeing ortho; will do PT; out of work due to pain, decreased ROM; FMLA paperwork   Acute bursitis of right shoulder       managed by rheum       Follow up plan: No follow-ups on file.  An after-visit summary was printed and given to the patient at [place].  Please see the patient instructions which may contain other information and recommendations beyond what is mentioned above in the assessment and plan.  No orders of the defined types were placed in this encounter.   No orders of the defined types were placed in this encounter.

## 2018-02-19 NOTE — Patient Instructions (Signed)
Please call me on Thursday of next week October 10th with an update on how you're progressing Continue current plans

## 2018-02-19 NOTE — Telephone Encounter (Signed)
Paperwork was just received today (Friday)

## 2018-03-02 NOTE — Telephone Encounter (Signed)
[employer] is calling in asking for office visit notes from 02/19/2018 to be faxed over.  Fax# [phone]

## 2018-03-02 NOTE — Telephone Encounter (Signed)
Notes faxed.

## 2018-03-15 ENCOUNTER — Telehealth: Payer: Self-pay

## 2018-03-15 NOTE — Telephone Encounter (Signed)
Can write return to work note.

## 2018-03-15 NOTE — Telephone Encounter (Signed)
Patient stopped by today.  States if you want her to return back to work tonight, she will need a doctors note stating she can return.  She states has had 3 appointments now with therapy over at [facility].

## 2018-03-16 NOTE — Telephone Encounter (Signed)
Thank you, okay to return to work

## 2018-03-26 ENCOUNTER — Ambulatory Visit (INDEPENDENT_AMBULATORY_CARE_PROVIDER_SITE_OTHER): Payer: Managed Care, Other (non HMO) | Admitting: Obstetrics and Gynecology

## 2018-03-26 ENCOUNTER — Encounter: Payer: Self-pay | Admitting: Obstetrics and Gynecology

## 2018-03-26 VITALS — BP 124/78 | Ht 65.0 in | Wt 222.0 lb

## 2018-03-26 DIAGNOSIS — N950 Postmenopausal bleeding: Secondary | ICD-10-CM

## 2018-03-26 DIAGNOSIS — R87612 Low grade squamous intraepithelial lesion on cytologic smear of cervix (LGSIL): Secondary | ICD-10-CM

## 2018-03-26 DIAGNOSIS — Z01419 Encounter for gynecological examination (general) (routine) without abnormal findings: Secondary | ICD-10-CM | POA: Diagnosis not present

## 2018-03-26 DIAGNOSIS — Z1331 Encounter for screening for depression: Secondary | ICD-10-CM

## 2018-03-26 DIAGNOSIS — Z124 Encounter for screening for malignant neoplasm of cervix: Secondary | ICD-10-CM

## 2018-03-26 DIAGNOSIS — Z1339 Encounter for screening examination for other mental health and behavioral disorders: Secondary | ICD-10-CM

## 2018-03-26 NOTE — Progress Notes (Signed)
Routine Annual Gynecology Examination   PCP: Arnetha Courser, MD  Chief Complaint  Patient presents with  . Annual Exam    History of Present Illness: Patient is a 57 y.o. G2P0 presents for annual exam. The patient has no complaints today.   Menopausal bleeding: She reports vaginal bleeding that started last week.  Prior to that it had been 10-12 years since her last bleed.  She noticed some small clots.  The bleeding lasted about 3-4 days.  She had to change a pad 4-5 times per day. She states that it wasn't heavy, it was just so "I knew it was there."  The color was red.  She notes sharp lower abdominal pain.  She can think of no inciting events.   She has had fluctuating weight.  Denies new constipation. She notes new bloating for about a couple of months.  She is not having bleeding today.   Menopausal symptoms: hot flashes frequently.   Breast symptoms: denies  Last pap smear: 2 years ago.  Result LGSIL, Colposcopy - CIN 1  Last mammogram: 1 years ago.  Result Normal   Last colonoscopy: 2016 - polyps found.  She is supposed to follow up in 5 years (2021).   Past Medical History:  Diagnosis Date  . Benign hypertension 08/19/2016  . Fatty liver 08/19/2016   Moderate to severe on CT scan March 2018  . Hypertension   . Thyroid disease   . Tobacco abuse 08/19/2016    Past Surgical History:  Procedure Laterality Date  . carpel tunnel    . COLONOSCOPY WITH PROPOFOL N/A 11/16/2014   Procedure: COLONOSCOPY WITH PROPOFOL;  Surgeon: Lucilla Lame, MD;  Location: [facility] ENDOSCOPY;  Service: Endoscopy;  Laterality: N/A;  . COLPOSCOPY    . HERNIA REPAIR  2002   Dr Bary Castilla  . TENDON RELEASE    . TUBAL LIGATION  2002    Prior to Admission medications   Medication Sig Start Date End Date Taking? Authorizing Provider  albuterol (PROVENTIL HFA;VENTOLIN HFA) 108 (90 Base) MCG/ACT inhaler Inhale 2-4 puffs by mouth every 4 hours as needed for wheezing, cough, and/or shortness of breath  02/13/18  Yes Wilhelmina Mcardle, MD  aspirin EC 81 MG tablet Take 1 tablet (81 mg total) by mouth daily. 10/28/17  Yes Lada, Satira Anis, MD  fluticasone (FLONASE) 50 MCG/ACT nasal spray Place 1 spray into both nostrils daily. Patient taking differently: Place 1 spray into both nostrils as needed.  10/13/17  Yes Hinda Kehr, MD  lidocaine (LIDODERM) 5 % Place 1 patch onto the skin every 12 (twelve) hours. Remove & Discard patch within 12 hours or as directed by MD.  Pershing Proud the patch off for 12 hours before applying a new one. 10/13/17 10/13/18 Yes Hinda Kehr, MD  meloxicam (MOBIC) 15 MG tablet Take 15 mg by mouth daily.  02/16/18  Yes [provider]  predniSONE (DELTASONE) 20 MG tablet Take 2 tablets (40 mg total) by mouth daily. With meal 02/12/18  Yes Lada, Satira Anis, MD  tiZANidine (ZANAFLEX) 4 MG tablet Take 1 tablet (4 mg total) by mouth every 8 (eight) hours as needed for muscle spasms. 02/12/18  Yes Lada, Satira Anis, MD  traMADol (ULTRAM) 50 MG tablet Take 50 mg by mouth as needed.  10/13/17  Yes [provider]   Allergies: No Known Allergies  Obstetric History: G2P0  Social History   Socioeconomic History  . Marital status: Divorced    Spouse name: Not on  file  . Number of children: Not on file  . Years of education: Not on file  . Highest education level: Not on file  Occupational History  . Not on file  Social Needs  . Financial resource strain: Not on file  . Food insecurity:    Worry: Not on file    Inability: Not on file  . Transportation needs:    Medical: Not on file    Non-medical: Not on file  Tobacco Use  . Smoking status: Current Every Day Smoker    Packs/day: 0.25    Years: 20.00    Pack years: 5.00    Types: Cigarettes    Last attempt to quit: 10/28/2017    Years since quitting: 0.4  . Smokeless tobacco: Never Used  Substance and Sexual Activity  . Alcohol use: No  . Drug use: No  . Sexual activity: Not Currently  Lifestyle  . Physical  activity:    Days per week: Not on file    Minutes per session: Not on file  . Stress: Not on file  Relationships  . Social connections:    Talks on phone: Not on file    Gets together: Not on file    Attends religious service: Not on file    Active member of club or organization: Not on file    Attends meetings of clubs or organizations: Not on file    Relationship status: Not on file  . Intimate partner violence:    Fear of current or ex partner: Not on file    Emotionally abused: Not on file    Physically abused: Not on file    Forced sexual activity: Not on file  Other Topics Concern  . Not on file  Social History Narrative  . Not on file    Family History  Problem Relation Age of Onset  . Multiple sclerosis Mother   . Clotting disorder Mother   . Hyperlipidemia Mother   . Stroke Mother   . Alcohol abuse Father   . Cirrhosis Father   . Alcohol abuse Brother   . Cirrhosis Brother   . Heart attack Maternal Grandmother   . Diabetes Paternal Grandmother     Review of Systems  Constitutional: Negative.   HENT: Negative.   Eyes: Negative.   Respiratory: Negative.   Cardiovascular: Negative.   Gastrointestinal: Negative.   Genitourinary: Negative.   Musculoskeletal: Negative.   Skin: Negative.   Neurological: Negative.   Psychiatric/Behavioral: Negative.      Physical Exam Vitals: BP 124/78   Ht 5\' 5"  (1.651 m)   Wt 222 lb (100.7 kg)   BMI 36.94 kg/m   Physical Exam  Constitutional: She is oriented to person, place, and time. She appears well-developed and well-nourished. No distress.  Genitourinary: Uterus normal. Pelvic exam was performed with patient supine. There is no rash, tenderness, lesion or injury on the right labia. There is no rash, tenderness, lesion or injury on the left labia. No erythema, tenderness or bleeding in the vagina. No signs of injury around the vagina. No vaginal discharge found. Right adnexum does not display mass, does not display  tenderness and does not display fullness. Left adnexum does not display mass, does not display tenderness and does not display fullness. Cervix does not exhibit motion tenderness, lesion, discharge or polyp.   Uterus is mobile and anteverted. Uterus is not enlarged, tender or exhibiting a mass.  HENT:  Head: Normocephalic and atraumatic.  Eyes: EOM are normal. No  scleral icterus.  Neck: Normal range of motion. Neck supple. No thyromegaly present.  Cardiovascular: Normal rate and regular rhythm. Exam reveals no gallop and no friction rub.  No murmur heard. Pulmonary/Chest: Effort normal and breath sounds normal. No respiratory distress. She has no wheezes. She has no rales. Right breast exhibits no inverted nipple, no mass, no nipple discharge, no skin change and no tenderness. Left breast exhibits no inverted nipple, no mass, no nipple discharge, no skin change and no tenderness.  Abdominal: Soft. Bowel sounds are normal. She exhibits no distension and no mass. There is no tenderness. There is no rebound and no guarding.  Musculoskeletal: Normal range of motion. She exhibits no edema or tenderness.  Lymphadenopathy:    She has no cervical adenopathy.       Right: No inguinal adenopathy present.       Left: No inguinal adenopathy present.  Neurological: She is alert and oriented to person, place, and time. No cranial nerve deficit.  Skin: Skin is warm and dry. No rash noted. No erythema.  Psychiatric: She has a normal mood and affect. Her behavior is normal. Judgment normal.    Endometrial Biopsy After discussion with the patient regarding her abnormal uterine bleeding I recommended that she proceed with an endometrial biopsy for further diagnosis. The risks, benefits, alternatives, and indications for an endometrial biopsy were discussed with the patient in detail. She understood the risks including infection, bleeding, cervical laceration and uterine perforation.  Verbal consent was obtained.     PROCEDURE NOTE:  Pipelle endometrial biopsy was performed using aseptic technique with iodine preparation.  The cervix was unable to be cannulated and, therefore, had to be abandoned.  The patient tolerated the procedure well.    Female chaperone present for pelvic and breast  portions of the physical exam  Results: AUDIT Questionnaire (screen for alcoholism): 1 PHQ-9: 6   Assessment and Plan:  57 y.o. G2P0 female here for routine annual gynecologic examination  Plan: Problem List Items Addressed This Visit    None    Visit Diagnoses    Women's annual routine gynecological examination    -  Primary   Relevant Orders   US PELVIS TRANSVANGINAL NON-OB (TV ONLY)   IGP,CtNg,AptimaHPV,rfx16/18,45   Screening for depression       Screening for alcoholism       Pap smear for cervical cancer screening       Relevant Orders   IGP,CtNg,AptimaHPV,rfx16/18,45   LGSIL on Pap smear of cervix       Relevant Orders   IGP,CtNg,AptimaHPV,rfx16/18,45   Postmenopausal bleeding       Relevant Orders   US PELVIS TRANSVANGINAL NON-OB (TV ONLY)   IGP,CtNg,AptimaHPV,rfx16/18,45      Screening: -- Blood pressure screen managed by PCP -- Colonoscopy - not due -- Mammogram - due. Patient to call [facility] to arrange. She understands that it is her responsibility to arrange this. -- Weight screening: obese: discussed management options, including lifestyle, dietary, and exercise. -- Depression screening negative (PHQ-9) -- Nutrition: normal -- cholesterol screening: per PCP -- osteoporosis screening: not due -- tobacco screening: using: discussed quitting using the 5 A's -- alcohol screening: AUDIT questionnaire indicates low-risk usage. -- family history of breast cancer screening: done. not at high risk. -- no evidence of domestic violence or intimate partner violence. -- STD screening: gonorrhea/chlamydia NAAT not collected per patient request. -- pap smear collected per ASCCP  guidelines -- HPV vaccination series: not eligilbe  Menopausal bleeding: endometrial biopsy  failed  today. Pap smear today.  Will order pelvic ultrasound and follow up accordingly.   15 minutes spent in face to face discussion with > 50% spent in counseling,management, and coordination of care of her menopausal bleeding.   Prentice Docker, MD 03/26/2018 12:21 PM

## 2018-04-01 LAB — IGP,CTNG,APTIMAHPV,RFX16/18,45
Chlamydia, Nuc. Acid Amp: NEGATIVE
GONOCOCCUS BY NUCLEIC ACID AMP: NEGATIVE
HPV APTIMA: NEGATIVE

## 2018-04-06 ENCOUNTER — Ambulatory Visit (INDEPENDENT_AMBULATORY_CARE_PROVIDER_SITE_OTHER): Payer: Managed Care, Other (non HMO)

## 2018-04-06 ENCOUNTER — Ambulatory Visit (INDEPENDENT_AMBULATORY_CARE_PROVIDER_SITE_OTHER): Payer: Managed Care, Other (non HMO) | Admitting: Obstetrics and Gynecology

## 2018-04-06 ENCOUNTER — Encounter: Payer: Self-pay | Admitting: Obstetrics and Gynecology

## 2018-04-06 DIAGNOSIS — N950 Postmenopausal bleeding: Secondary | ICD-10-CM | POA: Insufficient documentation

## 2018-04-06 DIAGNOSIS — N83292 Other ovarian cyst, left side: Secondary | ICD-10-CM

## 2018-04-06 DIAGNOSIS — Z01419 Encounter for gynecological examination (general) (routine) without abnormal findings: Secondary | ICD-10-CM

## 2018-04-06 NOTE — Progress Notes (Signed)
Gynecology Ultrasound Follow Up   Chief Complaint  Patient presents with  . Follow-up    Ultrasound for postmenopausal bleeding    History of Present Illness: Patient is a 57 y.o. female who presents today for ultrasound evaluation of the above .  Ultrasound demonstrates the following findings Adnexa:  Right ovary appears normal, left ovary with small complex cystic area measuring 0.9 x 1.2 x 0.8 cm.  Uterus: anteverted with endometrial stripe  3.1 mm Additional: no other abnormalities noted.   Past Medical History:  Diagnosis Date  . Benign hypertension 08/19/2016  . Fatty liver 08/19/2016   Moderate to severe on CT scan March 2018  . Hypertension   . Thyroid disease   . Tobacco abuse 08/19/2016    Past Surgical History:  Procedure Laterality Date  . carpel tunnel    . COLONOSCOPY WITH PROPOFOL N/A 11/16/2014   Procedure: COLONOSCOPY WITH PROPOFOL;  Surgeon: Lucilla Lame, MD;  Location: [facility] ENDOSCOPY;  Service: Endoscopy;  Laterality: N/A;  . COLPOSCOPY    . HERNIA REPAIR  2002   Dr Bary Castilla  . TENDON RELEASE    . TUBAL LIGATION  2002    Family History  Problem Relation Age of Onset  . Multiple sclerosis Mother   . Clotting disorder Mother   . Hyperlipidemia Mother   . Stroke Mother   . Alcohol abuse Father   . Cirrhosis Father   . Alcohol abuse Brother   . Cirrhosis Brother   . Heart attack Maternal Grandmother   . Diabetes Paternal Grandmother     Social History   Socioeconomic History  . Marital status: Divorced    Spouse name: Not on file  . Number of children: Not on file  . Years of education: Not on file  . Highest education level: Not on file  Occupational History  . Not on file  Social Needs  . Financial resource strain: Not on file  . Food insecurity:    Worry: Not on file    Inability: Not on file  . Transportation needs:    Medical: Not on file    Non-medical: Not on file  Tobacco Use  . Smoking status: Current Every Day Smoker   Packs/day: 0.25    Years: 20.00    Pack years: 5.00    Types: Cigarettes    Last attempt to quit: 10/28/2017    Years since quitting: 0.4  . Smokeless tobacco: Never Used  Substance and Sexual Activity  . Alcohol use: No  . Drug use: No  . Sexual activity: Not Currently  Lifestyle  . Physical activity:    Days per week: Not on file    Minutes per session: Not on file  . Stress: Not on file  Relationships  . Social connections:    Talks on phone: Not on file    Gets together: Not on file    Attends religious service: Not on file    Active member of club or organization: Not on file    Attends meetings of clubs or organizations: Not on file    Relationship status: Not on file  . Intimate partner violence:    Fear of current or ex partner: Not on file    Emotionally abused: Not on file    Physically abused: Not on file    Forced sexual activity: Not on file  Other Topics Concern  . Not on file  Social History Narrative  . Not on file    No Known  Allergies  Prior to Admission medications   Medication Sig Start Date End Date Taking? Authorizing Provider  albuterol (PROVENTIL HFA;VENTOLIN HFA) 108 (90 Base) MCG/ACT inhaler Inhale 2-4 puffs by mouth every 4 hours as needed for wheezing, cough, and/or shortness of breath 02/13/18   Wilhelmina Mcardle, MD  aspirin EC 81 MG tablet Take 1 tablet (81 mg total) by mouth daily. 10/28/17   Lada, Satira Anis, MD  fluticasone (FLONASE) 50 MCG/ACT nasal spray Place 1 spray into both nostrils daily. Patient taking differently: Place 1 spray into both nostrils as needed.  10/13/17   Hinda Kehr, MD  lidocaine (LIDODERM) 5 % Place 1 patch onto the skin every 12 (twelve) hours. Remove & Discard patch within 12 hours or as directed by MD.  Pershing Proud the patch off for 12 hours before applying a new one. 10/13/17 10/13/18  Hinda Kehr, MD  meloxicam (MOBIC) 15 MG tablet Take 15 mg by mouth daily.  02/16/18   [provider]  predniSONE (DELTASONE)  20 MG tablet Take 2 tablets (40 mg total) by mouth daily. With meal 02/12/18   Lada, Satira Anis, MD  tiZANidine (ZANAFLEX) 4 MG tablet Take 1 tablet (4 mg total) by mouth every 8 (eight) hours as needed for muscle spasms. 02/12/18   Lada, Satira Anis, MD  traMADol (ULTRAM) 50 MG tablet Take 50 mg by mouth as needed.  10/13/17   [provider]    Physical Exam BP 128/84   Ht 5\' 4"  (1.626 m)   Wt 222 lb (100.7 kg)   BMI 38.11 kg/m    General: NAD HEENT: normocephalic, anicteric Pulmonary: No increased work of breathing Extremities: no edema, erythema, or tenderness Neurologic: Grossly intact, normal gait Psychiatric: mood appropriate, affect full   Assessment: 57 y.o. G2P0  1. Postmenopausal bleeding      Plan: Problem List Items Addressed This Visit      Other   Postmenopausal bleeding     Ultrasound is reassuring today.  Endometrial stripe is 3.1 mm.  Patient reassured that given current research understanding, the symptoms can be followed.  However, she was given strict precautions to return to the clinic should her bleeding resume with the recommendation that should her bleeding resume, my recommendation for evaluation would be a hysteroscopy with dilation curettage, given that an office endometrial biopsy was not possible.  She voiced understanding and agreement with this plan. She has not made an appointment for her mammogram yet.  I reiterated that she needs to do this soon since her insurance will be going away soon.  15 minutes spent in face to face discussion with > 50% spent in counseling,management, and coordination of care of her postmenopausal bleeding.  Prentice Docker, MD, [facility], [facility] 04/06/2018 9:00 AM

## 2018-07-16 ENCOUNTER — Emergency Department
Admission: EM | Admit: 2018-07-16 | Discharge: 2018-07-16 | Disposition: A | Discharge status: Home / Self Care | Payer: Managed Care, Other (non HMO) | Attending: Emergency Medicine | Admitting: Emergency Medicine

## 2018-07-16 ENCOUNTER — Other Ambulatory Visit: Payer: Self-pay

## 2018-07-16 ENCOUNTER — Telehealth: Payer: Self-pay | Admitting: Emergency Medicine

## 2018-07-16 DIAGNOSIS — R109 Unspecified abdominal pain: Secondary | ICD-10-CM | POA: Insufficient documentation

## 2018-07-16 DIAGNOSIS — Z5321 Procedure and treatment not carried out due to patient leaving prior to being seen by health care provider: Secondary | ICD-10-CM | POA: Insufficient documentation

## 2018-07-16 LAB — CBC
HCT: 43 % (ref 36.0–46.0)
Hemoglobin: 14.8 g/dL (ref 12.0–15.0)
MCH: 28.5 pg (ref 26.0–34.0)
MCHC: 34.4 g/dL (ref 30.0–36.0)
MCV: 82.7 fL (ref 80.0–100.0)
Platelets: 223 10*3/uL (ref 150–400)
RBC: 5.2 MIL/uL — AB (ref 3.87–5.11)
RDW: 13.5 % (ref 11.5–15.5)
WBC: 11.4 10*3/uL — ABNORMAL HIGH (ref 4.0–10.5)
nRBC: 0 % (ref 0.0–0.2)

## 2018-07-16 LAB — URINALYSIS, COMPLETE (UACMP) WITH MICROSCOPIC
Bacteria, UA: NONE SEEN
Bilirubin Urine: NEGATIVE
Glucose, UA: NEGATIVE mg/dL
Hgb urine dipstick: NEGATIVE
Ketones, ur: NEGATIVE mg/dL
Leukocytes,Ua: NEGATIVE
Nitrite: NEGATIVE
PH: 5 (ref 5.0–8.0)
Protein, ur: 100 mg/dL — AB
Specific Gravity, Urine: 1.015 (ref 1.005–1.030)

## 2018-07-16 LAB — COMPREHENSIVE METABOLIC PANEL
ALT: 34 U/L (ref 0–44)
AST: 29 U/L (ref 15–41)
Albumin: 4.2 g/dL (ref 3.5–5.0)
Alkaline Phosphatase: 123 U/L (ref 38–126)
Anion gap: 8 (ref 5–15)
BUN: 10 mg/dL (ref 6–20)
CO2: 23 mmol/L (ref 22–32)
Calcium: 8.9 mg/dL (ref 8.9–10.3)
Chloride: 106 mmol/L (ref 98–111)
Creatinine, Ser: 0.75 mg/dL (ref 0.44–1.00)
GFR calc Af Amer: >60 mL/min (ref >60)
GFR calc non Af Amer: >60 mL/min (ref >60)
Glucose, Bld: 159 mg/dL — ABNORMAL HIGH (ref 70–99)
Potassium: 3.7 mmol/L (ref 3.5–5.1)
SODIUM: 137 mmol/L (ref 135–145)
Total Bilirubin: 0.6 mg/dL (ref 0.3–1.2)
Total Protein: 7.3 g/dL (ref 6.5–8.1)

## 2018-07-16 LAB — LIPASE, BLOOD: Lipase: 33 U/L (ref 11–51)

## 2018-07-16 MED ORDER — SODIUM CHLORIDE 0.9% FLUSH: 3.0000 mL | Freq: Once | INTRAVENOUS | Status: DC

## 2018-07-16 NOTE — ED Notes (Signed)
Pt up to desk to tell registration they were going somewhere else to be seen.

## 2018-07-16 NOTE — Telephone Encounter (Signed)
Called patient due to lwot to inquire about condition and follow up plans.no answer and no voicemail

## 2018-07-16 NOTE — ED Triage Notes (Signed)
Pt c/o generalized abd cramping since last night, denies N/V/D.Marland Kitchen

## 2019-02-07 IMAGING — CR DG CERVICAL SPINE COMPLETE 4+V
1 series · 6 of 6 positions shown · non-contrast
Comparison: None.

CLINICAL DATA: Right neck and shoulder pain for the past 5 days.
History of arthritis. No known injury.

EXAM:
CERVICAL SPINE - COMPLETE 4+ VIEW

[Series 1: dg cervical spine complete · 0.14mm/px · 6 of 6 slices shown]
[im 1/6]
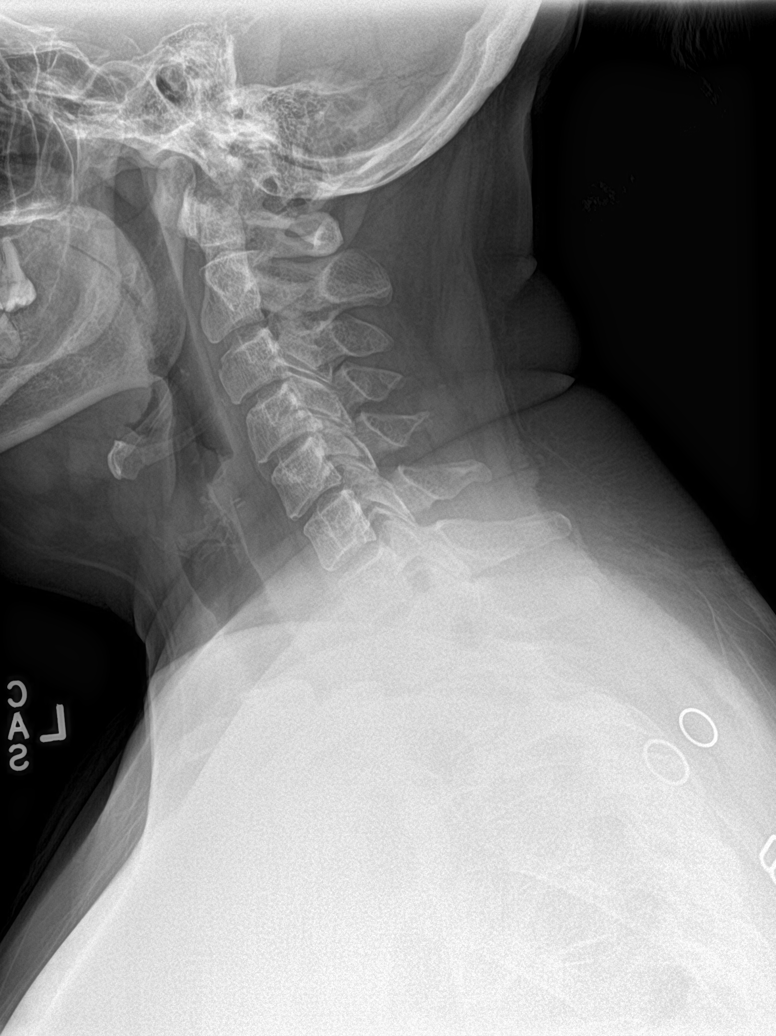
[im 2/6]
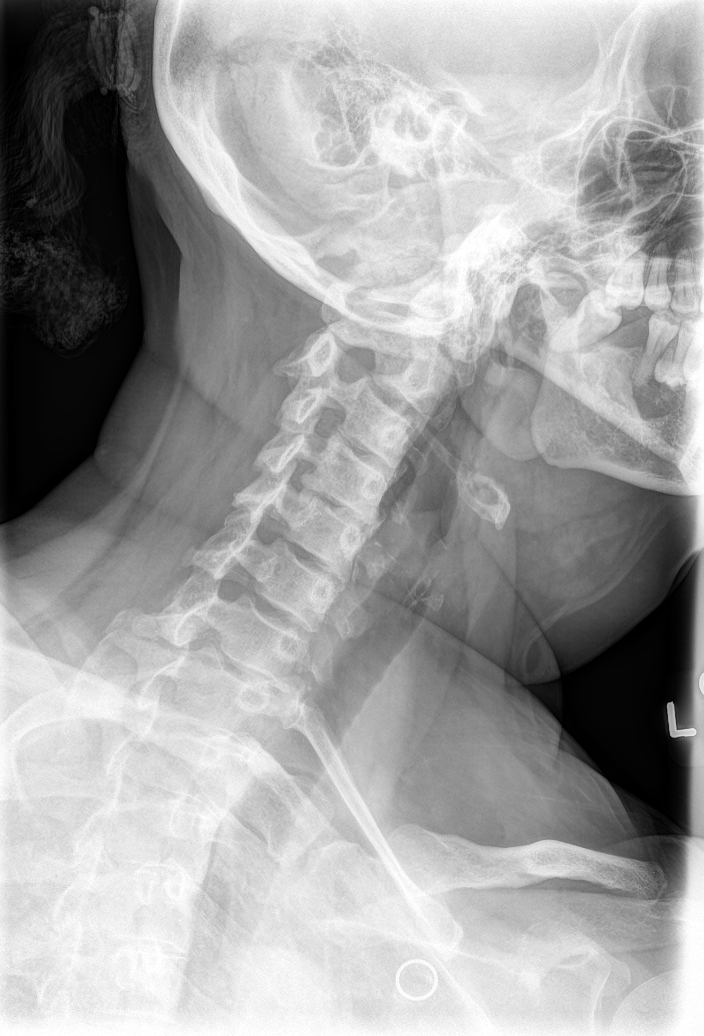
[im 3/6]
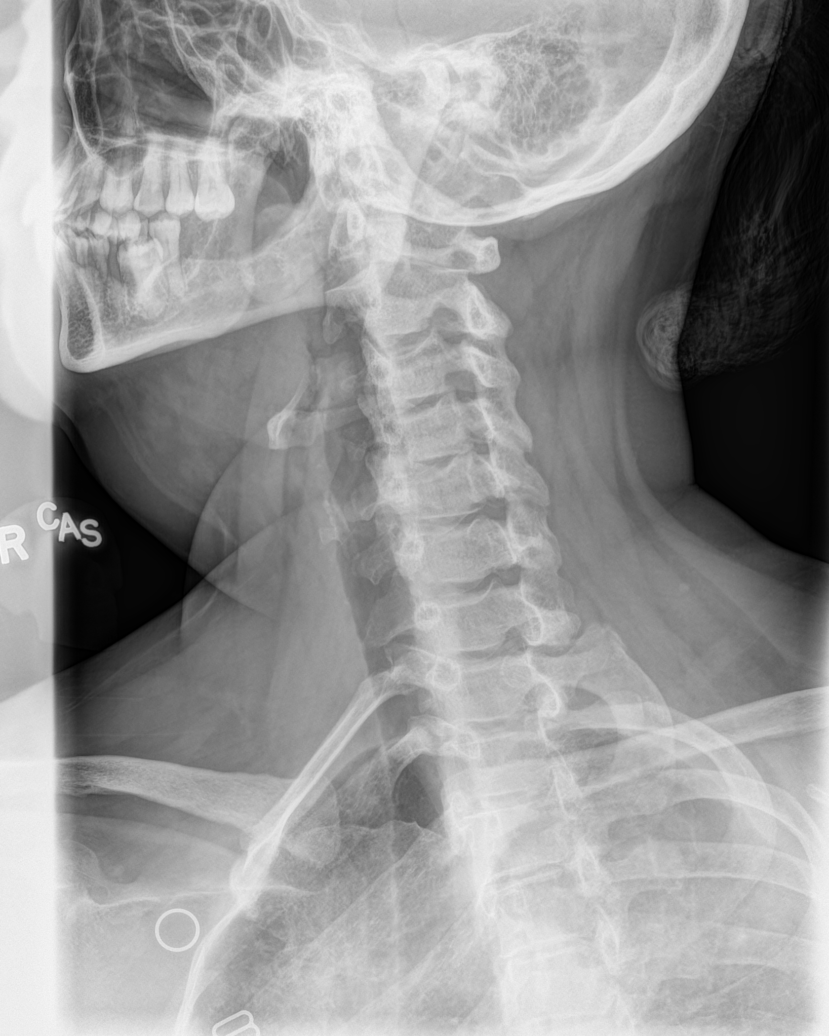
[im 4/6]
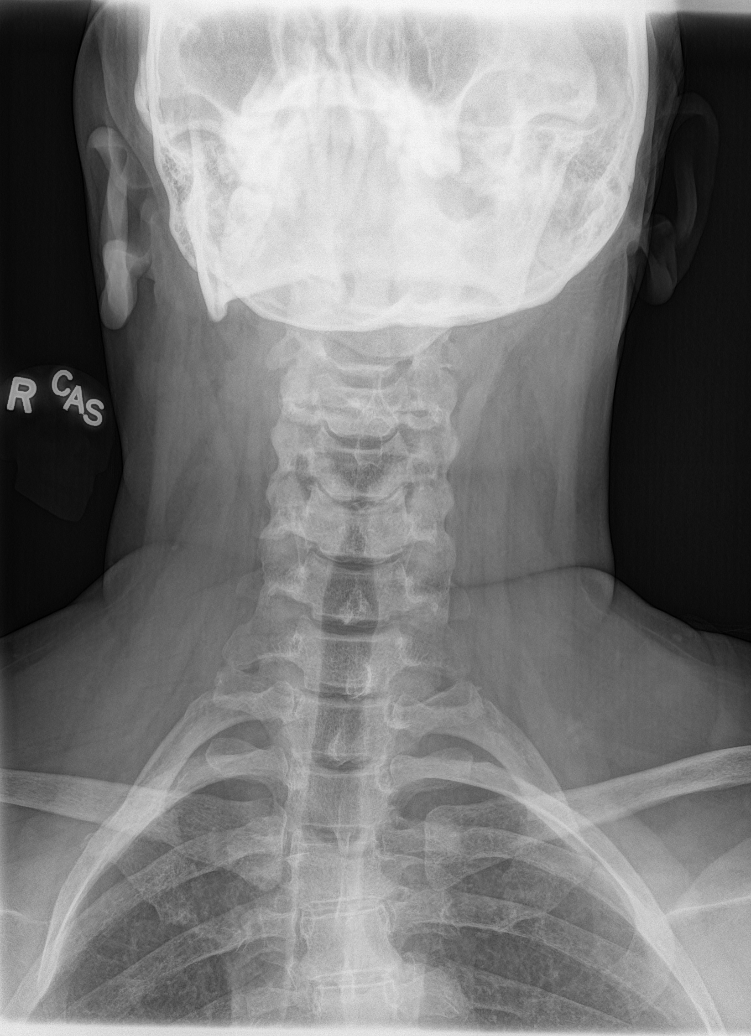
[im 5/6]
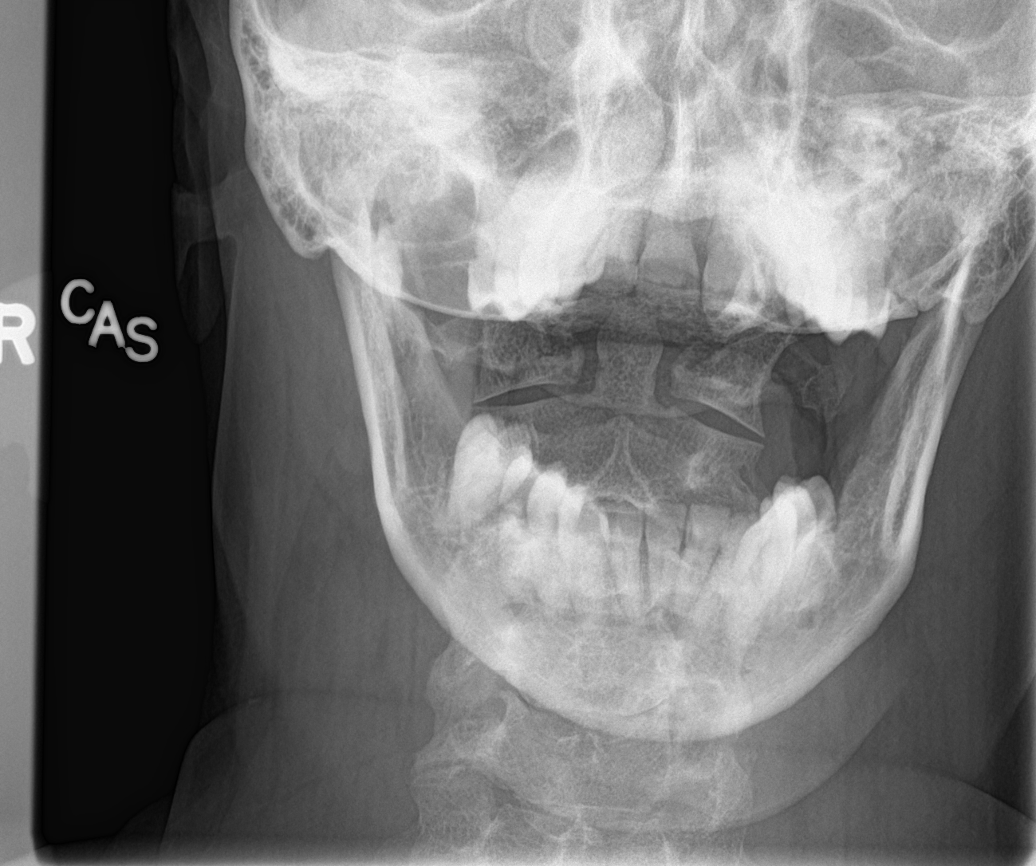
[im 6/6]
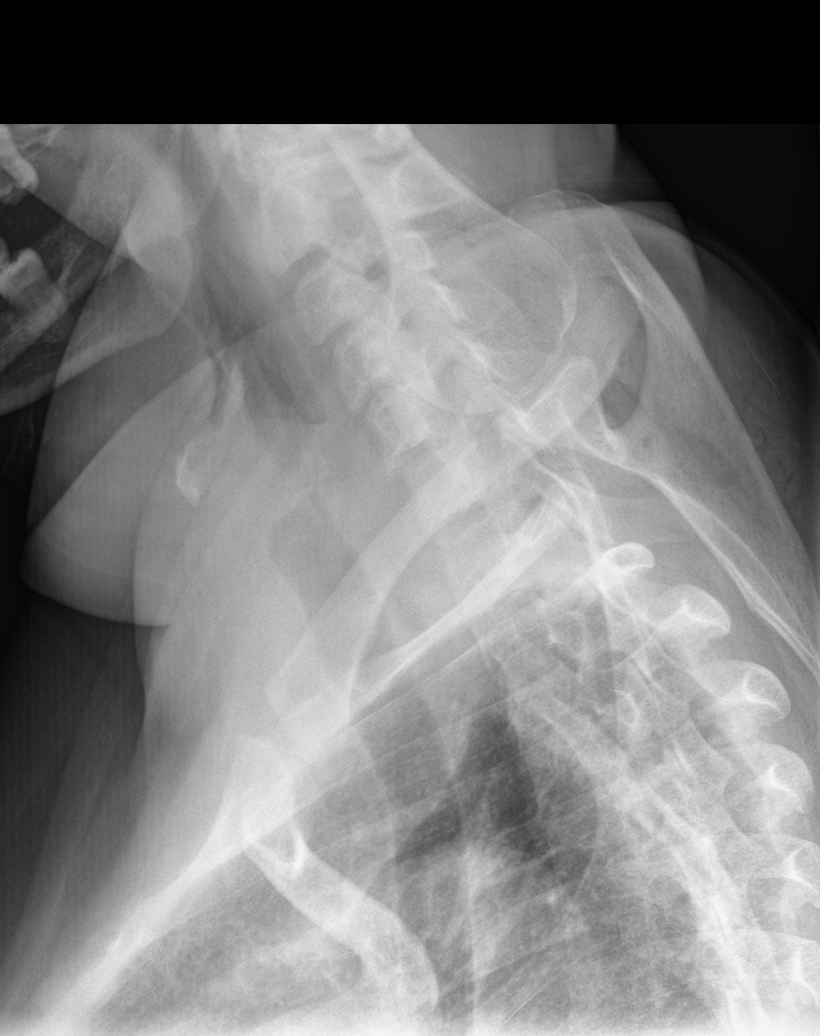

[6 of 6 positions shown; findings below may reference images not displayed]

FINDINGS: The cervical vertebral bodies are preserved in height. The disc
space heights are reasonably well-maintained. There is mild bony
encroachment upon the neural foramina bilaterally at C3-4 with the
greatest impact noted on the right. The prevertebral soft tissue
spaces are normal. The odontoid is intact. The spinous processes are
intact.
IMPRESSION: Mild bony encroachment upon the neural foramina bilaterally at C3-4
greatest on the right. This is likely secondary to endplate and
uncovertebral joint osteophyte. No high-grade disc space narrowing.
No compression fracture or listhesis.

## 2020-12-12 DIAGNOSIS — M545 Low back pain, unspecified: Secondary | ICD-10-CM | POA: Insufficient documentation

## 2020-12-13 DIAGNOSIS — E119 Type 2 diabetes mellitus without complications: Secondary | ICD-10-CM | POA: Insufficient documentation

## 2020-12-13 DIAGNOSIS — E1149 Type 2 diabetes mellitus with other diabetic neurological complication: Secondary | ICD-10-CM | POA: Insufficient documentation

## 2021-04-24 DIAGNOSIS — M79641 Pain in right hand: Secondary | ICD-10-CM | POA: Insufficient documentation

## 2022-02-25 DIAGNOSIS — U071 COVID-19: Secondary | ICD-10-CM | POA: Insufficient documentation

## 2022-02-25 DIAGNOSIS — R1310 Dysphagia, unspecified: Secondary | ICD-10-CM | POA: Insufficient documentation

## 2022-02-25 DIAGNOSIS — R1319 Other dysphagia: Secondary | ICD-10-CM | POA: Insufficient documentation

## 2022-02-25 DIAGNOSIS — H6992 Unspecified Eustachian tube disorder, left ear: Secondary | ICD-10-CM | POA: Insufficient documentation

## 2022-05-27 DIAGNOSIS — J410 Simple chronic bronchitis: Secondary | ICD-10-CM | POA: Diagnosis not present

## 2022-05-27 DIAGNOSIS — J40 Bronchitis, not specified as acute or chronic: Secondary | ICD-10-CM | POA: Diagnosis not present

## 2022-05-27 DIAGNOSIS — B001 Herpesviral vesicular dermatitis: Secondary | ICD-10-CM | POA: Diagnosis not present

## 2022-05-27 DIAGNOSIS — E119 Type 2 diabetes mellitus without complications: Secondary | ICD-10-CM | POA: Diagnosis not present

## 2022-05-27 DIAGNOSIS — I10 Essential (primary) hypertension: Secondary | ICD-10-CM | POA: Diagnosis not present

## 2022-05-27 DIAGNOSIS — Z720 Tobacco use: Secondary | ICD-10-CM | POA: Diagnosis not present

## 2022-06-03 DIAGNOSIS — M47816 Spondylosis without myelopathy or radiculopathy, lumbar region: Secondary | ICD-10-CM | POA: Diagnosis not present

## 2022-06-05 DIAGNOSIS — J101 Influenza due to other identified influenza virus with other respiratory manifestations: Secondary | ICD-10-CM | POA: Diagnosis not present

## 2022-06-05 DIAGNOSIS — Z03818 Encounter for observation for suspected exposure to other biological agents ruled out: Secondary | ICD-10-CM | POA: Diagnosis not present

## 2022-09-01 DIAGNOSIS — B9689 Other specified bacterial agents as the cause of diseases classified elsewhere: Secondary | ICD-10-CM | POA: Diagnosis not present

## 2022-09-01 DIAGNOSIS — J019 Acute sinusitis, unspecified: Secondary | ICD-10-CM | POA: Diagnosis not present

## 2022-09-26 DIAGNOSIS — E119 Type 2 diabetes mellitus without complications: Secondary | ICD-10-CM | POA: Diagnosis not present

## 2022-09-26 DIAGNOSIS — H4322 Crystalline deposits in vitreous body, left eye: Secondary | ICD-10-CM | POA: Diagnosis not present

## 2022-09-26 DIAGNOSIS — H524 Presbyopia: Secondary | ICD-10-CM | POA: Diagnosis not present

## 2022-09-26 DIAGNOSIS — H5213 Myopia, bilateral: Secondary | ICD-10-CM | POA: Diagnosis not present

## 2022-10-02 DIAGNOSIS — H5213 Myopia, bilateral: Secondary | ICD-10-CM | POA: Diagnosis not present

## 2022-11-04 DIAGNOSIS — M5136 Other intervertebral disc degeneration, lumbar region: Secondary | ICD-10-CM | POA: Diagnosis not present

## 2022-11-04 DIAGNOSIS — E119 Type 2 diabetes mellitus without complications: Secondary | ICD-10-CM | POA: Diagnosis not present

## 2022-11-04 DIAGNOSIS — M545 Low back pain, unspecified: Secondary | ICD-10-CM | POA: Diagnosis not present

## 2022-11-04 DIAGNOSIS — G8929 Other chronic pain: Secondary | ICD-10-CM | POA: Diagnosis not present

## 2022-11-04 DIAGNOSIS — Z0000 Encounter for general adult medical examination without abnormal findings: Secondary | ICD-10-CM | POA: Insufficient documentation

## 2022-11-04 DIAGNOSIS — R1319 Other dysphagia: Secondary | ICD-10-CM | POA: Diagnosis not present

## 2022-11-04 DIAGNOSIS — I10 Essential (primary) hypertension: Secondary | ICD-10-CM | POA: Diagnosis not present

## 2022-11-04 DIAGNOSIS — Z720 Tobacco use: Secondary | ICD-10-CM | POA: Diagnosis not present

## 2022-11-12 DIAGNOSIS — H524 Presbyopia: Secondary | ICD-10-CM | POA: Diagnosis not present

## 2023-03-23 ENCOUNTER — Encounter: Payer: Self-pay | Admitting: Family Medicine

## 2023-03-23 ENCOUNTER — Ambulatory Visit: Payer: Medicaid Other | Admitting: Family Medicine

## 2023-03-23 VITALS — BP 159/94 | HR 85 | Temp 98.3°F | Ht 63.0 in | Wt 218.0 lb

## 2023-03-23 DIAGNOSIS — Z1211 Encounter for screening for malignant neoplasm of colon: Secondary | ICD-10-CM

## 2023-03-23 DIAGNOSIS — Z1231 Encounter for screening mammogram for malignant neoplasm of breast: Secondary | ICD-10-CM

## 2023-03-23 DIAGNOSIS — Z8742 Personal history of other diseases of the female genital tract: Secondary | ICD-10-CM

## 2023-03-23 DIAGNOSIS — Z7985 Long-term (current) use of injectable non-insulin antidiabetic drugs: Secondary | ICD-10-CM

## 2023-03-23 DIAGNOSIS — Z7689 Persons encountering health services in other specified circumstances: Secondary | ICD-10-CM

## 2023-03-23 DIAGNOSIS — E119 Type 2 diabetes mellitus without complications: Secondary | ICD-10-CM

## 2023-03-23 DIAGNOSIS — Z79899 Other long term (current) drug therapy: Secondary | ICD-10-CM

## 2023-03-23 DIAGNOSIS — J849 Interstitial pulmonary disease, unspecified: Secondary | ICD-10-CM

## 2023-03-23 DIAGNOSIS — I10 Essential (primary) hypertension: Secondary | ICD-10-CM | POA: Diagnosis not present

## 2023-03-23 DIAGNOSIS — I700 Atherosclerosis of aorta: Secondary | ICD-10-CM

## 2023-03-23 DIAGNOSIS — R1310 Dysphagia, unspecified: Secondary | ICD-10-CM

## 2023-03-23 DIAGNOSIS — F322 Major depressive disorder, single episode, severe without psychotic features: Secondary | ICD-10-CM

## 2023-03-23 DIAGNOSIS — M47816 Spondylosis without myelopathy or radiculopathy, lumbar region: Secondary | ICD-10-CM

## 2023-03-23 MED ORDER — SEMAGLUTIDE(0.25 OR 0.5MG/DOS) 2 MG/3ML ~~LOC~~ SOPN: 0.5000 mg | PEN_INJECTOR | SUBCUTANEOUS | 0 refills | Status: DC

## 2023-03-23 MED ORDER — HYDROCHLOROTHIAZIDE 12.5 MG PO CAPS: 12.5000 mg | ORAL_CAPSULE | Freq: Every day | ORAL | 0 refills | Status: DC

## 2023-03-23 MED ORDER — ALBUTEROL SULFATE HFA 108 (90 BASE) MCG/ACT IN AERS: INHALATION_SPRAY | RESPIRATORY_TRACT | 1 refills | Status: AC

## 2023-03-23 NOTE — Assessment & Plan Note (Signed)
Continue albuterol nebulizer as needed Continue DuoNeb nebulizer as needed Will prescribe albuterol inhaler for use as needed as well on the outside of the home.

## 2023-03-23 NOTE — Assessment & Plan Note (Addendum)
Blood pressure elevated today Continue valsartan 320 mg daily Continue amlodipine 10 mg daily Start hydrochlorothiazide 12.5 mg daily

## 2023-03-23 NOTE — Progress Notes (Signed)
New patient visit   Patient: Brittany Castro   DOB: 1960/12/15   62 y.o. Female  MRN: [id number] Visit Date: 03/23/2023  Today's healthcare provider: Sherlyn Hay, DO   Chief Complaint  Patient presents with   Establish Care   Subjective    Brittany Castro is a 62 y.o. female who presents today as a new patient to establish care had a primary care office closer to her home.  HPI  Unsure of last annual exam  Tolerating Ozempic? Ran out of the ozempic.  - tolerated it well  Taking amlodipine 10 mg daily and valsartan 320 milligrams daily as prescribed?  - has not been checking in the past month  - running the same as today the month prior  - had gotten down to 130s/80s  - very stressed, hasn't worked since 2019.  - has depleted her finances and her kids have been supporting her.  - filed for disability back in February 2024; still waiting on final decision  Dysphagia -repeat EGD?  - has a lot of trouble taking the valsartan  - had previous stretching of esophagus  Colonoscopy overdue: q5y; last done 2016  PAP - has two abnormals the previous two checks  - most recent >5 years ago.  - cancer cells seen, per pt; she had conization procedure done.  - has not had additional PAP since then.  Current tobacco use?  Previously 1 pack per 2.5 days, then 1 pack per 3.5 days.  Was using nicotine replacement therapy  - still smoking; has 5-8 per day (1 pack per 3-4 days)  Requires nebulizer approx. once per week.  PHQ9 - 21 GAD7 - 15  Scared she's going to fall; has to make sure she is very stable before she starts walking. Depressed because she's having to rely on her kids and "the system".   Not taking metformin every day due to it causing intermittent fecal incontinence and because she can't walk well to get to the bathroom.  Tinlging to left hand; Whole hand - told she's not getting enough blood flow  to her hand - no neck or shoulder pain - does switch from side to  side when sleeping but she has to get up, not rollover, to do so - goes to the other side of the bed because her back hurts if she tries to roll over.  Had back injections in January 2024 at [facility]   Past Medical History:  Diagnosis Date   Arthritis    Asthma    Benign hypertension 08/19/2016   Fatty liver 08/19/2016   Moderate to severe on CT scan March 2018   Hypertension    Tobacco abuse 08/19/2016   Past Surgical History:  Procedure Laterality Date   carpel tunnel     COLONOSCOPY WITH PROPOFOL N/A 11/16/2014   Procedure: COLONOSCOPY WITH PROPOFOL;  Surgeon: Midge Minium, MD;  Location: [facility] ENDOSCOPY;  Service: Endoscopy;  Laterality: N/A;   COLPOSCOPY     HERNIA REPAIR  2002   Dr Lemar Livings   TENDON RELEASE     TUBAL LIGATION  2002   Family Status  Relation Name Status   Mother  Alive   Father  Deceased       cirohsis   Sister 1 Alive   Brother 1 Deceased       cirrhosis   Daughter 2 Alive   MGM  Deceased       heart attack  MGF  Deceased       old age   PGM  Deceased       DM   PGF  Deceased       old age  No partnership data on file   Family History  Problem Relation Age of Onset   Multiple sclerosis Mother    Clotting disorder Mother    Hyperlipidemia Mother    Stroke Mother    Osteoporosis Mother    Alcohol abuse Father    Cirrhosis Father    Alcohol abuse Brother    Cirrhosis Brother    Heart attack Maternal Grandmother    Diabetes Paternal Grandmother    Social History   Socioeconomic History   Marital status: Divorced    Spouse name: Not on file   Number of children: Not on file   Years of education: Not on file   Highest education level: Not on file  Occupational History   Not on file  Tobacco Use   Smoking status: Every Day    Current packs/day: 0.00    Average packs/day: 0.3 packs/day for 20.0 years (5.0 ttl pk-yrs)    Types: Cigarettes    Start date: 10/28/1997    Last attempt to quit: 10/28/2017    Years since  quitting: 5.4   Smokeless tobacco: Never  Vaping Use   Vaping status: Never Used  Substance and Sexual Activity   Alcohol use: No   Drug use: No   Sexual activity: Not Currently  Other Topics Concern   Not on file  Social History Narrative   Not on file   Social Determinants of Health   Financial Resource Strain: Low Risk  (10/30/2022)   Received from [facility]   Overall Financial Resource Strain (CARDIA)    Difficulty of Paying Living Expenses: Not very hard  Food Insecurity: Food Insecurity Present (10/30/2022)   [facility] from [facility]   Hunger Vital Sign    Worried About Running Out of Food in the Last Year: Sometimes true    Ran Out of Food in the Last Year: Patient declined  Transportation Needs: No Transportation Needs (10/30/2022)   Received from [facility]   PRAPARE - Transportation    Lack of Transportation (Medical): No    Lack of Transportation (Non-Medical): No  Physical Activity: Not on file  Stress: Not on file  Social Connections: Not on file   Outpatient Medications Prior to Visit  Medication Sig Note   albuterol (ACCUNEB) 0.63 MG/3ML nebulizer solution Take 1 ampule by nebulization every 6 (six) hours as needed for wheezing.    amLODipine (NORVASC) 10 MG tablet Take 10 mg by mouth daily.    aspirin EC 81 MG tablet Take 1 tablet (81 mg total) by mouth daily.    atorvastatin (LIPITOR) 40 MG tablet Take 40 mg by mouth daily.    celecoxib (CELEBREX) 100 MG capsule Take 100 mg by mouth 2 (two) times daily.    docusate sodium (COLACE) 100 MG capsule Take 100 mg by mouth 2 (two) times daily.    fluticasone (FLONASE) 50 MCG/ACT nasal spray Place 1 spray into both nostrils daily. (Patient taking differently: Place 1 spray into both nostrils as needed.)    ipratropium-albuterol (DUONEB) 0.5-2.5 (3) MG/3ML SOLN Take 3 mLs by nebulization.    valsartan (DIOVAN) 320 MG tablet Take 320 mg by mouth daily.    [DISCONTINUED] albuterol (PROVENTIL HFA;VENTOLIN  HFA) 108 (90 Base) MCG/ACT inhaler Inhale 2-4 puffs by mouth  every 4 hours as needed for wheezing, cough, and/or shortness of breath    [DISCONTINUED] metFORMIN (GLUCOPHAGE) 500 MG tablet Take 500 mg by mouth 2 (two) times daily with a meal. 03/23/2023: severe loose stool, fecal incontinence   [DISCONTINUED] meloxicam (MOBIC) 15 MG tablet Take 15 mg by mouth daily.     [DISCONTINUED] predniSONE (DELTASONE) 20 MG tablet Take 2 tablets (40 mg total) by mouth daily. With meal    [DISCONTINUED] tiZANidine (ZANAFLEX) 4 MG tablet Take 1 tablet (4 mg total) by mouth every 8 (eight) hours as needed for muscle spasms.    [DISCONTINUED] traMADol (ULTRAM) 50 MG tablet Take 50 mg by mouth as needed.     No facility-administered medications prior to visit.   No Known Allergies  Immunization History  Administered Date(s) Administered   PFIZER Comirnaty(Gray Top)Covid-19 Tri-Sucrose Vaccine 01/16/2021    Health Maintenance  Topic Date Due   FOOT EXAM  Never done   OPHTHALMOLOGY EXAM  Never done   DTaP/Tdap/Td (1 - Tdap) Never done   Zoster Vaccines- Shingrix (1 of 2) Never done   MAMMOGRAM  01/15/2019   Colonoscopy  11/16/2019   Cervical Cancer Screening (HPV/Pap Cotest)  03/27/2023   COVID-19 Vaccine (2 - Pfizer risk series) 04/08/2023 (Originally 02/06/2021)   INFLUENZA VACCINE  08/17/2023 (Originally 12/18/2022)   HEMOGLOBIN A1C  09/20/2023   Diabetic kidney evaluation - eGFR measurement  03/22/2024   Diabetic kidney evaluation - Urine ACR  03/22/2024   Hepatitis C Screening  Completed   HPV VACCINES  Aged Out   HIV Screening  Discontinued    Patient Care Team: Welborn Keena, Monico Blitz, DO as PCP - General (Family Medicine) Lemar Livings, Merrily Pew, MD as Consulting Physician (General Surgery) Lada, Janit Bern, MD as Referring Physician (Family Medicine)  Review of Systems  Constitutional:  Negative for appetite change, chills, fatigue and fever.  Eyes:  Negative for visual disturbance.  Respiratory:   Negative for chest tightness and shortness of breath.   Cardiovascular:  Negative for chest pain and palpitations.  Gastrointestinal:  Negative for abdominal pain, nausea and vomiting.  Genitourinary:  Negative for dysuria.  Neurological:  Positive for weakness and numbness (tingling in left hand, esp at night). Negative for dizziness, tremors, light-headedness and headaches.        Objective    BP (!) 159/94 (BP Location: Right Arm, Patient Position: Sitting, Cuff Size: Normal)   Pulse 85   Temp 98.3 F (36.8 C) (Oral)   Ht 5\' 3"  (1.6 m)   Wt 218 lb (98.9 kg)   SpO2 100%   BMI 38.62 kg/m     Physical Exam Vitals and nursing note reviewed.  Constitutional:      General: She is not in acute distress.    Appearance: Normal appearance.  HENT:     Head: Normocephalic and atraumatic.  Eyes:     General: No scleral icterus.    Conjunctiva/sclera: Conjunctivae normal.  Cardiovascular:     Rate and Rhythm: Normal rate.  Pulmonary:     Effort: Pulmonary effort is normal.  Neurological:     Mental Status: She is alert and oriented to person, place, and time. Mental status is at baseline.  Psychiatric:        Mood and Affect: Mood normal.        Behavior: Behavior normal.     Depression Screen    03/23/2023   10:19 AM 02/19/2018    9:16 AM 02/12/2018    3:30 PM 12/31/2017  7:56 AM  PHQ 2/9 Scores  PHQ - 2 Score 5 0 0 0  PHQ- 9 Score 21 4 0      Assessment & Plan     Interstitial lung disease (HCC) Assessment & Plan: Continue albuterol nebulizer as needed Continue DuoNeb nebulizer as needed Will prescribe albuterol inhaler for use as needed as well on the outside of the home.  Orders: -     Albuterol Sulfate HFA; Inhale 2-4 puffs by mouth every 4 hours as needed for wheezing, cough, and/or shortness of breath  Dispense: 1 each; Refill: 1  Establishing care with new doctor, encounter for  Morbid obesity [facility]) Assessment & Plan: Counseled patient on diet and  exercise.  Discussed benefit of patient's Ozempic for weight loss, as well as diabetes control.   Primary hypertension Assessment & Plan: Blood pressure elevated today Continue valsartan 320 mg daily Continue amlodipine 10 mg daily Start hydrochlorothiazide 12.5 mg daily  Orders: -     Comprehensive metabolic panel -     Lipid panel -     hydroCHLOROthiazide; Take 1 capsule (12.5 mg total) by mouth daily.  Dispense: 30 capsule; Refill: 0  Dysphagia, unspecified type Assessment & Plan: Referred patient to local gastroenterology for further evaluation of her dysphagia.  Orders: -     Ambulatory referral to Gastroenterology  Controlled type 2 diabetes mellitus without complication, without long-term current use of insulin (HCC) Assessment & Plan: Discontinue metformin due to intolerance of side effects Prescribed next dose (0.5 mg) of Ozempic, as patient ran out of her previous prescription and is due for her upcoming dose today.  Orders: -     Semaglutide(0.25 or 0.5MG /DOS); Inject 0.5 mg into the skin once a week.  Dispense: 3 mL; Refill: 0 -     Microalbumin / creatinine urine ratio -     Hemoglobin A1c -     Vitamin B12  Depression, major, single episode, severe (HCC) Assessment & Plan: Offered patient referral to psych for counseling, which she declined for the time being.  She is not willing to try a medication for her depression at this time.  Continue to monitor.   Lumbar facet arthropathy -     Ambulatory referral to Pain Clinic  Aortic atherosclerosis [facility]) Assessment & Plan: Noted.  No acute concerns.   Encounter for screening mammogram for breast cancer -     3D Screening Mammogram, Left and Right; Future  Encounter for colorectal cancer screening -     Ambulatory referral to Gastroenterology  High risk medication use Assessment & Plan: Will check vitamin B12 level due to long-term use of metformin.  Orders: -     Vitamin B12  History of abnormal  cervical Pap smear -     Ambulatory referral to Obstetrics / Gynecology    Return in about 2 weeks (around 04/06/2023) for PAP smear, CPE, HTN.     I discussed the assessment and treatment plan with the patient  The patient was provided an opportunity to ask questions and all were answered. The patient agreed with the plan and demonstrated an understanding of the instructions.   The patient was advised to call back or seek an in-person evaluation if the symptoms worsen or if the condition fails to improve as anticipated.  Total time was 75 minutes. That includes chart review before the visit, the actual patient visit, and time spent on documentation after the visit.     Sherlyn Hay, DO  [facility]  [facility] [phone] (phone) [phone] (fax)  [facility]

## 2023-03-28 LAB — LIPID PANEL
Chol/HDL Ratio: 4.9 ratio — ABNORMAL HIGH (ref 0.0–4.4)
Cholesterol, Total: 192 mg/dL (ref 100–199)
HDL: 39 mg/dL — ABNORMAL LOW (ref >39)
LDL Chol Calc (NIH): 125 mg/dL — ABNORMAL HIGH (ref 0–99)
Triglycerides: 159 mg/dL — ABNORMAL HIGH (ref 0–149)
VLDL Cholesterol Cal: 28 mg/dL (ref 5–40)

## 2023-03-28 LAB — COMPREHENSIVE METABOLIC PANEL
ALT: 22 [IU]/L (ref 0–32)
AST: 25 IU/L (ref 0–40)
Albumin: 4.3 g/dL (ref 3.9–4.9)
Alkaline Phosphatase: 159 IU/L — ABNORMAL HIGH (ref 44–121)
BUN/Creatinine Ratio: 12 (ref 12–28)
BUN: 8 mg/dL (ref 8–27)
Bilirubin Total: 0.4 mg/dL (ref 0.0–1.2)
CO2: 22 mmol/L (ref 20–29)
Calcium: 9.7 mg/dL (ref 8.7–10.3)
Chloride: 103 mmol/L (ref 96–106)
Creatinine, Ser: 0.67 mg/dL (ref 0.57–1.00)
Globulin, Total: 3.3 g/dL (ref 1.5–4.5)
Glucose: 132 mg/dL — ABNORMAL HIGH (ref 70–99)
Potassium: 4.7 mmol/L (ref 3.5–5.2)
Sodium: 141 mmol/L (ref 134–144)
Total Protein: 7.6 g/dL (ref 6.0–8.5)
eGFR: 99 mL/min/{1.73_m2} (ref >59)

## 2023-03-28 LAB — HEMOGLOBIN A1C
Est. average glucose Bld gHb Est-mCnc: 174 mg/dL
Hgb A1c MFr Bld: 7.7 % — ABNORMAL HIGH (ref 4.8–5.6)

## 2023-03-28 LAB — MICROALBUMIN / CREATININE URINE RATIO
Creatinine, Urine: 59.2 mg/dL
Microalb/Creat Ratio: 42 mg/g{creat} — ABNORMAL HIGH (ref 0–29)
Microalbumin, Urine: 24.9 ug/mL

## 2023-03-28 LAB — VITAMIN B12

## 2023-03-30 ENCOUNTER — Encounter: Payer: Self-pay | Admitting: Family Medicine

## 2023-03-30 DIAGNOSIS — E119 Type 2 diabetes mellitus without complications: Secondary | ICD-10-CM | POA: Insufficient documentation

## 2023-03-30 DIAGNOSIS — F322 Major depressive disorder, single episode, severe without psychotic features: Secondary | ICD-10-CM | POA: Insufficient documentation

## 2023-03-30 DIAGNOSIS — M47816 Spondylosis without myelopathy or radiculopathy, lumbar region: Secondary | ICD-10-CM | POA: Insufficient documentation

## 2023-03-30 DIAGNOSIS — Z8742 Personal history of other diseases of the female genital tract: Secondary | ICD-10-CM | POA: Insufficient documentation

## 2023-03-30 NOTE — Assessment & Plan Note (Signed)
Counseled patient on diet and exercise.  Discussed benefit of patient's Ozempic for weight loss, as well as diabetes control.

## 2023-03-30 NOTE — Assessment & Plan Note (Addendum)
Noted.  No acute concerns.

## 2023-03-30 NOTE — Assessment & Plan Note (Signed)
Will check vitamin B12 level due to long-term use of metformin.

## 2023-03-30 NOTE — Assessment & Plan Note (Signed)
Discontinue metformin due to intolerance of side effects Prescribed next dose (0.5 mg) of Ozempic, as patient ran out of her previous prescription and is due for her upcoming dose today.

## 2023-03-30 NOTE — Assessment & Plan Note (Signed)
Referred patient to local gastroenterology for further evaluation of her dysphagia.

## 2023-03-30 NOTE — Assessment & Plan Note (Signed)
Offered patient referral to psych for counseling, which she declined for the time being.  She is not willing to try a medication for her depression at this time.  Continue to monitor.

## 2023-03-31 ENCOUNTER — Other Ambulatory Visit: Payer: Self-pay

## 2023-03-31 MED ORDER — ROSUVASTATIN CALCIUM 40 MG PO TABS: 40.0000 mg | ORAL_TABLET | Freq: Every day | ORAL | 3 refills | Status: AC

## 2023-03-31 NOTE — Progress Notes (Unsigned)
    GYNECOLOGY PROGRESS NOTE  Subjective:  PCP: Sherlyn Hay, DO  Patient ID: Brittany Castro, female    DOB: 10/25/1960, 62 y.o.   MRN: [id number]  HPI  Patient is a 62 y.o. G2P0 female who was referred to [place] by Dr. Jacquenette Shone for a pap smear. Had a abnormal pap 10/22/15 showing LSIL, pt reports she had a LEEP with Dr. Jean Rosenthal after that and the pathology showed "cancer cells" and she did not follow up after that due to change in insurance. Records show most recent pap 04/05/18 -NILM, HPV NEG. No PMB or other acute complaints.   The following portions of the patient's history were reviewed and updated as appropriate: allergies, current medications, past family history, past medical history, past social history, past surgical history, and problem list.  Review of Systems Pertinent items are noted in HPI.   Objective:   Blood pressure 120/80, pulse 97, height 5\' 3"  (1.6 m), weight 217 lb (98.4 kg). Body mass index is 38.44 kg/m.  General appearance: alert and cooperative, deconditioned Abdomen: soft, non-tender; bowel sounds normal; no masses,  no organomegaly Pelvic: cervix normal in appearance, external genitalia normal, no adnexal masses or tenderness, no cervical motion tenderness, uterus normal size, shape, and consistency, and vagina normal without discharge. Pap obtained. Extremities: extremities normal, atraumatic, no cyanosis or edema Neurologic: Grossly normal   Assessment/Plan:   1. Cervical cancer screening   2. History of abnormal cervical Pap smear   -Cotesting performed today. -Results to MyChart -Follow up prn, and with PCP as scheduled.    Julieanne Manson, DO [facility]

## 2023-04-01 ENCOUNTER — Other Ambulatory Visit (HOSPITAL_COMMUNITY)
Admission: RE | Admit: 2023-04-01 | Discharge: 2023-04-01 | Disposition: A | Discharge status: Home / Self Care | Payer: Medicaid Other | Source: Ambulatory Visit | Attending: Obstetrics | Admitting: Obstetrics

## 2023-04-01 ENCOUNTER — Ambulatory Visit (INDEPENDENT_AMBULATORY_CARE_PROVIDER_SITE_OTHER): Payer: Medicaid Other | Admitting: Obstetrics

## 2023-04-01 ENCOUNTER — Encounter: Payer: Self-pay | Admitting: Obstetrics

## 2023-04-01 VITALS — BP 120/80 | HR 97 | Ht 63.0 in | Wt 217.0 lb

## 2023-04-01 DIAGNOSIS — Z8742 Personal history of other diseases of the female genital tract: Secondary | ICD-10-CM | POA: Insufficient documentation

## 2023-04-01 DIAGNOSIS — Z124 Encounter for screening for malignant neoplasm of cervix: Secondary | ICD-10-CM | POA: Insufficient documentation

## 2023-04-03 LAB — CYTOLOGY - PAP
Adequacy: ABSENT
Chlamydia: NEGATIVE
Comment: NEGATIVE
Comment: NEGATIVE
Comment: NORMAL
Diagnosis: NEGATIVE
High risk HPV: NEGATIVE
Neisseria Gonorrhea: NEGATIVE

## 2023-04-13 ENCOUNTER — Encounter: Payer: Medicaid Other | Admitting: Family Medicine

## 2023-04-14 ENCOUNTER — Other Ambulatory Visit: Payer: Self-pay | Admitting: Family Medicine

## 2023-04-14 DIAGNOSIS — I10 Essential (primary) hypertension: Secondary | ICD-10-CM

## 2023-04-18 NOTE — Progress Notes (Unsigned)
Patient: Brittany Castro  Service Category: E/M  Provider: Oswaldo Done, MD  DOB: 27-Aug-1960  DOS: 04/20/2023  Referring Provider: Sherlyn Hay DO  MRN: [id number]  Setting: Ambulatory outpatient  PCP: Sherlyn Hay, DO  Type: New Patient  Specialty: Interventional Pain Management    Location: Office  Delivery: Face-to-face     Primary Reason(s) for Visit: Encounter for initial evaluation of one or more chronic problems (new to examiner) potentially causing chronic pain, and posing a threat to normal musculoskeletal function. (Level of risk: High) CC: No chief complaint on file.  HPI  Brittany Castro is a 62 y.o. year old, female patient, who comes for the first time to our practice referred by Sherlyn Hay, DO for our initial evaluation of her chronic pain. She has Special screening for malignant neoplasms, colon; Benign neoplasm of ascending colon; Benign neoplasm of transverse colon; Benign neoplasm of sigmoid colon; Rectal polyp; Cold sore; Right upper quadrant abdominal pain; Generalized abdominal pain; Fatty liver; Primary hypertension; Tobacco abuse; Abdominal bloating; Redundant colon; Aortic atherosclerosis (HCC); Interstitial lung disease (HCC); Morbid obesity (HCC); Degenerative disc disease, lumbar; Facet arthritis, degenerative, lumbar spine; AC (acromioclavicular) arthritis; Postmenopausal bleeding; High risk medication use; Dysphagia; Controlled type 2 diabetes mellitus without complication, without long-term current use of insulin (HCC); Lumbar facet arthropathy; History of abnormal cervical Pap smear; and Depression, major, single episode, severe (HCC) on their problem list. Today she comes in for evaluation of her No chief complaint on file.  Pain Assessment: Location:     Radiating:   Onset:   Duration:   Quality:   Severity:  /10 (subjective, self-reported pain score)  Effect on ADL:   Timing:   Modifying factors:   BP:    HR:    Onset and Duration: {Hx; Onset and  Duration:210120511} Cause of pain: {Hx; Cause:210120521} Severity: {Pain Severity:210120502} Timing: {Symptoms; Timing:210120501} Aggravating Factors: {Causes; Aggravating pain factors:210120507} Alleviating Factors: {Causes; Alleviating Factors:210120500} Associated Problems: {Hx; Associated problems:210120515} Quality of Pain: {Hx; Symptom quality or Descriptor:210120531} Previous Examinations or Tests: {Hx; Previous examinations or test:210120529} Previous Treatments: {Hx; Previous Treatment:210120503}  Brittany Castro is being evaluated for possible interventional pain management therapies for the treatment of her chronic pain.  Discussed the use of AI scribe software for clinical note transcription with the patient, who gave verbal consent to proceed.  History of Present Illness           *** Brittany Castro has been informed that this initial visit was an evaluation only.  On the follow up appointment I will go over the results, including ordered tests and available interventional therapies. At that time she will have the opportunity to decide whether to proceed with offered therapies or not. In the event that Brittany Castro prefers avoiding interventional options, this will conclude our involvement in the case.  Medication management recommendations may be provided upon request.  Patient informed that diagnostic tests may be ordered to assist in identifying underlying causes, narrow the list of differential diagnoses and aid in determining candidacy for (or contraindications to) planned therapeutic interventions.  Historic Controlled Substance Pharmacotherapy Review  PMP and historical list of controlled substances: None  Most recently prescribed opioid analgesics:   None MME/day: 0 mg/day  Historical Monitoring: The patient  reports no history of drug use. List of prior UDS Testing: No results found for: "MDMA", "COCAINSCRNUR", "PCPSCRNUR", "PCPQUANT", "CANNABQUANT", "THCU", "ETH", "CBDTHCR",  "D8THCCBX", "D9THCCBX" Historical Background Evaluation: [place] PMP: PDMP reviewed during this encounter. Review of the  past 78-months conducted.             PMP NARX Score Report:  Narcotic: 000 Sedative: 000 Stimulant: 000 [place] Department of public safety, offender search: Engineer, mining Information) Non-contributory Risk Assessment Profile: Aberrant behavior: None observed or detected today Risk factors for fatal opioid overdose: None identified today PMP NARX Overdose Risk Score: 000 Fatal overdose hazard ratio (HR): Calculation deferred Non-fatal overdose hazard ratio (HR): Calculation deferred Risk of opioid abuse or dependence: 0.7-3.0% with doses <= 36 MME/day and 6.1-26% with doses >= 120 MME/day. Substance use disorder (SUD) risk level: See below Personal History of Substance Abuse (SUD-Substance use disorder):  Alcohol:    Illegal Drugs:    Rx Drugs:    ORT Risk Level calculation:    ORT Scoring interpretation table:  Score <3 = Low Risk for SUD  Score between 4-7 = Moderate Risk for SUD  Score >8 = High Risk for Opioid Abuse   PHQ-2 Depression Scale:  Total score:    PHQ-2 Scoring interpretation table: (Score and probability of major depressive disorder)  Score 0 = No depression  Score 1 = 15.4% Probability  Score 2 = 21.1% Probability  Score 3 = 38.4% Probability  Score 4 = 45.5% Probability  Score 5 = 56.4% Probability  Score 6 = 78.6% Probability   PHQ-9 Depression Scale:  Total score:    PHQ-9 Scoring interpretation table:  Score 0-4 = No depression  Score 5-9 = Mild depression  Score 10-14 = Moderate depression  Score 15-19 = Moderately severe depression  Score 20-27 = Severe depression (2.4 times higher risk of SUD and 2.89 times higher risk of overuse)   Pharmacologic Plan: As per protocol, I have not taken over any controlled substance management, pending the results of ordered tests and/or consults.            Initial impression: Pending review of available  data and ordered tests.  Meds   Current Outpatient Medications:    albuterol (ACCUNEB) 0.63 MG/3ML nebulizer solution, Take 1 ampule by nebulization every 6 (six) hours as needed for wheezing., Disp: , Rfl:    albuterol (VENTOLIN HFA) 108 (90 Base) MCG/ACT inhaler, Inhale 2-4 puffs by mouth every 4 hours as needed for wheezing, cough, and/or shortness of breath, Disp: 1 each, Rfl: 1   amLODipine (NORVASC) 10 MG tablet, Take 10 mg by mouth daily., Disp: , Rfl:    aspirin EC 81 MG tablet, Take 1 tablet (81 mg total) by mouth daily., Disp: , Rfl:    celecoxib (CELEBREX) 100 MG capsule, Take 100 mg by mouth 2 (two) times daily., Disp: , Rfl:    docusate sodium (COLACE) 100 MG capsule, Take 100 mg by mouth 2 (two) times daily., Disp: , Rfl:    fluticasone (FLONASE) 50 MCG/ACT nasal spray, Place 1 spray into both nostrils daily. (Patient taking differently: Place 1 spray into both nostrils as needed.), Disp: 16 g, Rfl: 1   hydrochlorothiazide (MICROZIDE) 12.5 MG capsule, TAKE 1 CAPSULE BY MOUTH EVERY DAY, Disp: 90 capsule, Rfl: 1   ipratropium-albuterol (DUONEB) 0.5-2.5 (3) MG/3ML SOLN, Take 3 mLs by nebulization., Disp: , Rfl:    rosuvastatin (CRESTOR) 40 MG tablet, Take 1 tablet (40 mg total) by mouth daily., Disp: 90 tablet, Rfl: 3   Semaglutide,0.25 or 0.5MG /DOS, 2 MG/3ML SOPN, Inject 0.5 mg into the skin once a week., Disp: 3 mL, Rfl: 0   valsartan (DIOVAN) 320 MG tablet, Take 320 mg by mouth daily., Disp: , Rfl:  Imaging Review  Cervical Imaging: Cervical DG complete: Results for orders placed during the hospital encounter of 02/12/18 DG Cervical Spine Complete  Narrative CLINICAL DATA:  Right neck and shoulder pain for the past 5 days. History of arthritis. No known injury.  EXAM: CERVICAL SPINE - COMPLETE 4+ VIEW  COMPARISON:  None.  FINDINGS: The cervical vertebral bodies are preserved in height. The disc space heights are reasonably well-maintained. There is mild  bony encroachment upon the neural foramina bilaterally at C3-4 with the greatest impact noted on the right. The prevertebral soft tissue spaces are normal. The odontoid is intact. The spinous processes are intact.  IMPRESSION: Mild bony encroachment upon the neural foramina bilaterally at C3-4 greatest on the right. This is likely secondary to endplate and uncovertebral joint osteophyte. No high-grade disc space narrowing. No compression fracture or listhesis.   Electronically Signed By: David  [place] M.D. On: 02/15/2018 08:08  Shoulder Imaging: Shoulder-R DG: Results for orders placed during the hospital encounter of 02/12/18 DG Shoulder Right  Narrative CLINICAL DATA:  Five day history of right neck and right shoulder pain with no known injury. Decreased range of motion. History of arthritis.  EXAM: RIGHT SHOULDER - 2+ VIEW  COMPARISON:  Limited views of the right shoulder from a chest x-ray of November 02, 2017  FINDINGS: The bones are subjectively adequately mineralized. The glenohumeral joint space is well maintained. There is mild degenerative change of the [facility] joint. The subacromial subdeltoid space is well-maintained. There is no acute fracture or dislocation.  IMPRESSION: There is no acute bony abnormality of the right shoulder. There is mild degenerative change of the [facility] joint.   Electronically Signed By: David  [place] M.D. On: 02/15/2018 08:07  Complexity Note: Imaging results reviewed.                         ROS  Cardiovascular: {Hx; Cardiovascular History:210120525} Pulmonary or Respiratory: {Hx; Pumonary and/or Respiratory History:210120523} Neurological: {Hx; Neurological:210120504} Psychological-Psychiatric: {Hx; Psychological-Psychiatric History:210120512} Gastrointestinal: {Hx; Gastrointestinal:210120527} Genitourinary: {Hx; Genitourinary:210120506} Hematological: {Hx; Hematological:210120510} Endocrine: {Hx; Endocrine  history:210120509} Rheumatologic: {Hx; Rheumatological:210120530} Musculoskeletal: {Hx; Musculoskeletal:210120528} Work History: {Hx; Work history:210120514}  Allergies  Brittany Castro has No Known Allergies.  Laboratory Chemistry Profile   Renal Lab Results  Component Value Date   BUN 8 03/23/2023   CREATININE 0.67 03/23/2023   BCR 12 03/23/2023   GFRAA >60 07/16/2018   GFRNONAA >60 07/16/2018   SPECGRAV 1.014 10/28/2017   PHUR 5.0 10/28/2017   PROTEINUR 100 (A) 07/16/2018     Electrolytes Lab Results  Component Value Date   NA 141 03/23/2023   K 4.7 03/23/2023   CL 103 03/23/2023   CALCIUM 9.7 03/23/2023     Hepatic Lab Results  Component Value Date   AST 25 03/23/2023   ALT 22 03/23/2023   ALBUMIN 4.3 03/23/2023   ALKPHOS 159 (H) 03/23/2023   LIPASE 33 07/16/2018     ID No results found for: "LYMEIGGIGMAB", "HIV", "SARSCOV2NAA", "STAPHAUREUS", "MRSAPCR", "HCVAB", "PREGTESTUR", "RMSFIGG", "QFVRPH1IGG", "QFVRPH2IGG"   Bone No results found for: "VD25OH", "VD125OH2TOT", "GM0102VO5", "DG6440HK7", "25OHVITD1", "25OHVITD2", "25OHVITD3", "TESTOFREE", "TESTOSTERONE"   Endocrine Lab Results  Component Value Date   GLUCOSE 132 (H) 03/23/2023   GLUCOSEU NEGATIVE 07/16/2018   HGBA1C 7.7 (H) 03/23/2023     Neuropathy Lab Results  Component Value Date   VITAMINB12 CANCELED 03/23/2023   HGBA1C 7.7 (H) 03/23/2023     CNS No results found for: "COLORCSF", "APPEARCSF", "RBCCOUNTCSF", "WBCCSF", "POLYSCSF", "LYMPHSCSF", "  EOSCSF", "PROTEINCSF", "GLUCCSF", "JCVIRUS", "CSFOLI", "IGGCSF", "LABACHR", "ACETBL"   Inflammation (CRP: Acute  ESR: Chronic) No results found for: "CRP", "ESRSEDRATE", "LATICACIDVEN"   Rheumatology No results found for: "RF", "ANA", "LABURIC", "URICUR", "LYMEIGGIGMAB", "LYMEABIGMQN", "HLAB27"   Coagulation Lab Results  Component Value Date   APTT 26.3 12/28/2013   PLT 223 07/16/2018     Cardiovascular Lab Results  Component Value Date    CKTOTAL 95 12/28/2013   CKMB 0.6 12/28/2013   TROPONINI <0.03 08/14/2016   HGB 14.8 07/16/2018   HCT 43.0 07/16/2018     Screening No results found for: "SARSCOV2NAA", "COVIDSOURCE", "STAPHAUREUS", "MRSAPCR", "HCVAB", "HIV", "PREGTESTUR"   Cancer No results found for: "CEA", "CA125", "LABCA2"   Allergens No results found for: "ALMOND", "APPLE", "ASPARAGUS", "AVOCADO", "BANANA", "BARLEY", "BASIL", "BAYLEAF", "GREENBEAN", "LIMABEAN", "WHITEBEAN", "BEEFIGE", "REDBEET", "BLUEBERRY", "BROCCOLI", "CABBAGE", "MELON", "CARROT", "CASEIN", "CASHEWNUT", "CAULIFLOWER", "CELERY"     Note: Lab results reviewed.  PFSH  Drug: Brittany Castro  reports no history of drug use. Alcohol:  reports no history of alcohol use. Tobacco:  reports that she has been smoking cigarettes. She started smoking about 25 years ago. She has a 5 pack-year smoking history. She has never used smokeless tobacco. Medical:  has a past medical history of Arthritis, Asthma, Benign hypertension (08/19/2016), Fatty liver (08/19/2016), Hypertension, and Tobacco abuse (08/19/2016). Family: family history includes Alcohol abuse in her brother and father; Cirrhosis in her brother and father; Clotting disorder in her mother; Diabetes in her paternal grandmother; Heart attack in her maternal grandmother; Hyperlipidemia in her mother; Multiple sclerosis in her mother; Osteoporosis in her mother; Stroke in her mother.  Past Surgical History:  Procedure Laterality Date   carpel tunnel     COLONOSCOPY WITH PROPOFOL N/A 11/16/2014   Procedure: COLONOSCOPY WITH PROPOFOL;  Surgeon: Midge Minium, MD;  Location: [facility] ENDOSCOPY;  Service: Endoscopy;  Laterality: N/A;   COLPOSCOPY     HERNIA REPAIR  2002   Dr Lemar Livings   TENDON RELEASE     TUBAL LIGATION  2002   Active Ambulatory Problems    Diagnosis Date Noted   Special screening for malignant neoplasms, colon    Benign neoplasm of ascending colon    Benign neoplasm of transverse colon    Benign  neoplasm of sigmoid colon    Rectal polyp    Cold sore 05/22/2015   Right upper quadrant abdominal pain 07/03/2015   Generalized abdominal pain 08/19/2016   Fatty liver 08/19/2016   Primary hypertension 08/19/2016   Tobacco abuse 08/19/2016   Abdominal bloating 08/29/2016   Redundant colon 08/29/2016   Aortic atherosclerosis (HCC) 10/28/2017   Interstitial lung disease (HCC) 10/28/2017   Morbid obesity (HCC) 10/28/2017   Degenerative disc disease, lumbar 10/28/2017   Facet arthritis, degenerative, lumbar spine 10/28/2017   AC (acromioclavicular) arthritis 02/15/2018   Postmenopausal bleeding 04/06/2018   High risk medication use 03/23/2023   Dysphagia 03/23/2023   Controlled type 2 diabetes mellitus without complication, without long-term current use of insulin (HCC) 03/30/2023   Lumbar facet arthropathy 03/30/2023   History of abnormal cervical Pap smear 03/30/2023   Depression, major, single episode, severe (HCC) 03/30/2023   Resolved Ambulatory Problems    Diagnosis Date Noted   Pharyngitis 03/13/2015   Obesity, Class II, BMI 35-39.9 03/13/2015   Bronchitis with bronchospasm 05/22/2015   Past Medical History:  Diagnosis Date   Arthritis    Asthma    Benign hypertension 08/19/2016   Hypertension    Constitutional Exam  General appearance: Well nourished,  well developed, and well hydrated. In no apparent acute distress There were no vitals filed for this visit. BMI Assessment: Estimated body mass index is 38.44 kg/m as calculated from the following:   Height as of 04/01/23: 5\' 3"  (1.6 m).   Weight as of 04/01/23: 217 lb (98.4 kg).  BMI interpretation table: BMI level Category Range association with higher incidence of chronic pain  <18 kg/m2 Underweight   18.5-24.9 kg/m2 Ideal body weight   25-29.9 kg/m2 Overweight Increased incidence by 20%  30-34.9 kg/m2 Obese (Class I) Increased incidence by 68%  35-39.9 kg/m2 Severe obesity (Class II) Increased incidence by  136%  >40 kg/m2 Extreme obesity (Class III) Increased incidence by 254%   Patient's current BMI Ideal Body weight  There is no height or weight on file to calculate BMI. Patient weight not recorded   BMI Readings from Last 4 Encounters:  04/01/23 38.44 kg/m  03/23/23 38.62 kg/m  07/16/18 43.40 kg/m  04/06/18 38.11 kg/m   Wt Readings from Last 4 Encounters:  04/01/23 217 lb (98.4 kg)  03/23/23 218 lb (98.9 kg)  07/16/18 245 lb (111.1 kg)  04/06/18 222 lb (100.7 kg)    Psych/Mental status: Alert, oriented x 3 (person, place, & time)       Eyes: PERLA Respiratory: No evidence of acute respiratory distress  Assessment  Primary Diagnosis & Pertinent Problem List: {There were no encounter diagnoses. (Refresh or delete this SmartLink)}  Visit Diagnosis (New problems to examiner): No diagnosis found. Plan of Care (Initial workup plan)  Note: Brittany Castro was reminded that as per protocol, today's visit has been an evaluation only. We have not taken over the patient's controlled substance management.  Problem-specific plan: Assessment and Plan            Lab Orders  No laboratory test(s) ordered today   Imaging Orders  No imaging studies ordered today   Referral Orders  No referral(s) requested today   Procedure Orders    No procedure(s) ordered today   Pharmacotherapy (current): Medications ordered:  No orders of the defined types were placed in this encounter.  Medications administered during this visit: Brittany Castro had no medications administered during this visit.   Analgesic Pharmacotherapy:  Opioid Analgesics: For patients currently taking or requesting to take opioid analgesics, in accordance with [facility] Guidelines, we will assess their risks and indications for the use of these substances. After completing our evaluation, we may offer recommendations, but we no longer take patients for medication management. The prescribing  physician will ultimately decide, based on his/her training and level of comfort whether to adopt any of the recommendations, including whether or not to prescribe such medicines.  Membrane stabilizer: To be determined at a later time  Muscle relaxant: To be determined at a later time  NSAID: To be determined at a later time  Other analgesic(s): To be determined at a later time   Interventional management options: Brittany Castro was informed that there is no guarantee that she would be a candidate for interventional therapies. The decision will be based on the results of diagnostic studies, as well as Brittany Castro's risk profile.  Procedure(s) under consideration:  Pending results of ordered studies      Interventional Therapies  Risk Factors  Considerations  Medical Comorbidities:     Planned  Pending:      Under consideration:   Pending   Completed:   None at this time   Therapeutic  Palliative (  PRN) options:   None established   Completed by other providers:   None reported       Provider-requested follow-up: No follow-ups on file.  Future Appointments  Date Time Provider Department Center  04/20/2023  9:00 AM Delano Metz, MD [facility] None  05/19/2023  8:20 AM Pardue, Monico Blitz, DO [facility]    Duration of encounter: *** minutes.  Total time on encounter, as per AMA guidelines included both the face-to-face and non-face-to-face time personally spent by the physician and/or other qualified health care professional(s) on the day of the encounter (includes time in activities that require the physician or other qualified health care professional and does not include time in activities normally performed by clinical staff). Physician's time may include the following activities when performed: Preparing to see the patient (e.g., pre-charting review of records, searching for previously ordered imaging, lab work, and nerve conduction tests) Review of prior analgesic  pharmacotherapies. Reviewing PMP Interpreting ordered tests (e.g., lab work, imaging, nerve conduction tests) Performing post-procedure evaluations, including interpretation of diagnostic procedures Obtaining and/or reviewing separately obtained history Performing a medically appropriate examination and/or evaluation Counseling and educating the patient/family/caregiver Ordering medications, tests, or procedures Referring and communicating with other health care professionals (when not separately reported) Documenting clinical information in the electronic or other health record Independently interpreting results (not separately reported) and communicating results to the patient/ family/caregiver Care coordination (not separately reported)  Note by: Oswaldo Done, MD (AI and TTS technology used. I apologize for any typographical errors that were not detected and corrected.) Date: 04/20/2023; Time: 5:08 PM

## 2023-04-20 ENCOUNTER — Encounter: Payer: Self-pay | Admitting: Pain Medicine

## 2023-04-20 ENCOUNTER — Ambulatory Visit: Payer: Medicaid Other | Attending: Pain Medicine | Admitting: Pain Medicine

## 2023-04-20 VITALS — BP 154/104 | HR 101 | Temp 97.8°F | Resp 16 | Ht 63.0 in | Wt 217.0 lb

## 2023-04-20 DIAGNOSIS — Z789 Other specified health status: Secondary | ICD-10-CM | POA: Diagnosis not present

## 2023-04-20 DIAGNOSIS — M4316 Spondylolisthesis, lumbar region: Secondary | ICD-10-CM | POA: Insufficient documentation

## 2023-04-20 DIAGNOSIS — Z79899 Other long term (current) drug therapy: Secondary | ICD-10-CM | POA: Diagnosis not present

## 2023-04-20 DIAGNOSIS — R200 Anesthesia of skin: Secondary | ICD-10-CM | POA: Diagnosis not present

## 2023-04-20 DIAGNOSIS — M545 Low back pain, unspecified: Secondary | ICD-10-CM | POA: Insufficient documentation

## 2023-04-20 DIAGNOSIS — R202 Paresthesia of skin: Secondary | ICD-10-CM | POA: Diagnosis not present

## 2023-04-20 DIAGNOSIS — R1011 Right upper quadrant pain: Secondary | ICD-10-CM | POA: Diagnosis not present

## 2023-04-20 DIAGNOSIS — M25551 Pain in right hip: Secondary | ICD-10-CM | POA: Insufficient documentation

## 2023-04-20 DIAGNOSIS — M5386 Other specified dorsopathies, lumbar region: Secondary | ICD-10-CM | POA: Diagnosis not present

## 2023-04-20 DIAGNOSIS — R5381 Other malaise: Secondary | ICD-10-CM | POA: Insufficient documentation

## 2023-04-20 DIAGNOSIS — G8929 Other chronic pain: Secondary | ICD-10-CM | POA: Insufficient documentation

## 2023-04-20 DIAGNOSIS — M899 Disorder of bone, unspecified: Secondary | ICD-10-CM | POA: Insufficient documentation

## 2023-04-20 DIAGNOSIS — R937 Abnormal findings on diagnostic imaging of other parts of musculoskeletal system: Secondary | ICD-10-CM | POA: Diagnosis not present

## 2023-04-20 DIAGNOSIS — M5414 Radiculopathy, thoracic region: Secondary | ICD-10-CM | POA: Diagnosis not present

## 2023-04-20 DIAGNOSIS — M546 Pain in thoracic spine: Secondary | ICD-10-CM | POA: Insufficient documentation

## 2023-04-20 DIAGNOSIS — G894 Chronic pain syndrome: Secondary | ICD-10-CM | POA: Diagnosis not present

## 2023-04-20 NOTE — Patient Instructions (Addendum)
Pain Management Discharge Instructions  General Discharge Instructions :  If you need to reach your doctor call: Monday-Friday 8:00 am - 4:00 pm at [phone] or toll free [phone].  After clinic hours [phone] to have operator reach doctor.  Bring all of your medication bottles to all your appointments in the pain clinic.  To cancel or reschedule your appointment with Pain Management please remember to call 24 hours in advance to avoid a fee.  Refer to the educational materials which you have been given on: General Risks, I had my Procedure. Discharge Instructions, Post Sedation.  Post Procedure Instructions:  The drugs you were given will stay in your system until tomorrow, so for the next 24 hours you should not drive, make any legal decisions or drink any alcoholic beverages.  You may eat anything you prefer, but it is better to start with liquids then soups and crackers, and gradually work up to solid foods.  Please notify your doctor immediately if you have any unusual bleeding, trouble breathing or pain that is not related to your normal pain.  Depending on the type of procedure that was done, some parts of your body may feel week and/or numb.  This usually clears up by tonight or the next day.  Walk with the use of an assistive device or accompanied by an adult for the 24 hours.  You may use ice on the affected area for the first 24 hours.  Put ice in a Ziploc bag and cover with a towel and place against area 15 minutes on 15 minutes off.  You may switch to heat after 24 hours. ______________________________________________________________________    New Patients  Welcome to [facility].   Initial Visit The first or initial visit consists of an evaluation only.   Interventional pain management.  We offer therapies other than opioid controlled substances to manage chronic pain. These include, but are  not limited to, diagnostic, therapeutic, and palliative specialized injection therapies (i.e.: Epidural Steroids, Facet Blocks, etc.). We specialize in a variety of nerve blocks as well as radiofrequency treatments. We offer pain implant evaluations and trials, as well as follow up management. In addition we also provide a variety joint injections, including Viscosupplementation (AKA: Gel Therapy).  Prescription Pain Medication. We specialize in alternatives to opioids. We can provide evaluations and recommendations for/of pharmacologic therapies based on CDC Guidelines.  We no longer take patients for long-term medication management. We will not be taking over your pain medications.  ______________________________________________________________________      ______________________________________________________________________    Patient Information update  To: All of our patients.  Re: Name change.  It has been made official that our current name, "[facility]"   will soon be changed to "[facility]".   The purpose of this change is to eliminate any confusion created by the concept of our practice being a "Medication Management Pain Clinic". In the past this has led to the misconception that we treat pain primarily by the use of prescription medications.  Nothing can be farther from the truth.   Understanding PAIN MANAGEMENT: To further understand what our practice does, you first have to understand that "Pain Management" is a subspecialty that requires additional training once a physician has completed their specialty training, which can be in either Anesthesia, Neurology, Psychiatry, or Physical Medicine and Rehabilitation (PMR). Each one of these contributes to the final approach  taken by each physician to the management of their patient's pain. To be a "Pain Management Specialist"  you must have first completed one of the specialty trainings below.  Anesthesiologists - trained in clinical pharmacology and interventional techniques such as nerve blockade and regional as well as central neuroanatomy. They are trained to block pain before, during, and after surgical interventions.  Neurologists - trained in the diagnosis and pharmacological treatment of complex neurological conditions, such as Multiple Sclerosis, Parkinson's, spinal cord injuries, and other systemic conditions that may be associated with symptoms that may include but are not limited to pain. They tend to rely primarily on the treatment of chronic pain using prescription medications.  Psychiatrist - trained in conditions affecting the psychosocial wellbeing of patients including but not limited to depression, anxiety, schizophrenia, personality disorders, addiction, and other substance use disorders that may be associated with chronic pain. They tend to rely primarily on the treatment of chronic pain using prescription medications.   Physical Medicine and Rehabilitation (PMR) physicians, also known as physiatrists - trained to treat a wide variety of medical conditions affecting the brain, spinal cord, nerves, bones, joints, ligaments, muscles, and tendons. Their training is primarily aimed at treating patients that have suffered injuries that have caused severe physical impairment. Their training is primarily aimed at the physical therapy and rehabilitation of those patients. They may also work alongside orthopedic surgeons or neurosurgeons using their expertise in assisting surgical patients to recover after their surgeries.  INTERVENTIONAL PAIN MANAGEMENT is sub-subspecialty of Pain Management.  Our physicians are Board-certified in Anesthesia, Pain Management, and Interventional Pain Management.  This meaning that not only have they been trained and Board-certified in their specialty of Anesthesia, and subspecialty  of Pain Management, but they have also received further training in the sub-subspecialty of Interventional Pain Management, in order to become Board-certified as INTERVENTIONAL PAIN MANAGEMENT SPECIALIST.    Mission: Our goal is to use our skills in  INTERVENTIONAL PAIN MANAGEMENT as alternatives to the chronic use of prescription opioid medications for the treatment of pain. To make this more clear, we have changed our name to reflect what we do and offer. We will continue to offer medication management assessment and recommendations, but we will not be taking over any patient's medication management.  ______________________________________________________________________

## 2023-04-20 NOTE — Progress Notes (Signed)
Safety precautions to be maintained throughout the outpatient stay will include: orient to surroundings, keep bed in low position, maintain call bell within reach at all times, provide assistance with transfer out of bed and ambulation.   Patient with sore throat/cold. States she is coughing up mucous. Mask given  and report given to MD.

## 2023-04-25 LAB — COMPLIANCE DRUG ANALYSIS, UR

## 2023-04-26 DIAGNOSIS — Z03818 Encounter for observation for suspected exposure to other biological agents ruled out: Secondary | ICD-10-CM | POA: Diagnosis not present

## 2023-04-29 ENCOUNTER — Ambulatory Visit
Admission: RE | Admit: 2023-04-29 | Discharge: 2023-04-29 | Disposition: A | Discharge status: Home / Self Care | Payer: Medicaid Other | Source: Ambulatory Visit | Attending: Pain Medicine | Admitting: Pain Medicine

## 2023-04-29 DIAGNOSIS — R937 Abnormal findings on diagnostic imaging of other parts of musculoskeletal system: Secondary | ICD-10-CM | POA: Diagnosis not present

## 2023-04-29 DIAGNOSIS — M4316 Spondylolisthesis, lumbar region: Secondary | ICD-10-CM | POA: Insufficient documentation

## 2023-04-29 DIAGNOSIS — M47816 Spondylosis without myelopathy or radiculopathy, lumbar region: Secondary | ICD-10-CM | POA: Diagnosis not present

## 2023-04-29 DIAGNOSIS — M5137 Other intervertebral disc degeneration, lumbosacral region with discogenic back pain only: Secondary | ICD-10-CM | POA: Diagnosis not present

## 2023-04-29 DIAGNOSIS — I700 Atherosclerosis of aorta: Secondary | ICD-10-CM | POA: Diagnosis not present

## 2023-05-01 LAB — 25-HYDROXY VITAMIN D LCMS D2+D3
25-Hydroxy, Vitamin D-2: <1 ng/mL
25-Hydroxy, Vitamin D-3: 8.5 ng/mL
25-Hydroxy, Vitamin D: 8.8 ng/mL — ABNORMAL LOW

## 2023-05-01 LAB — COMP. METABOLIC PANEL (12)
AST: 21 [IU]/L (ref 0–40)
Albumin: 4.2 g/dL (ref 3.9–4.9)
Alkaline Phosphatase: 154 [IU]/L — ABNORMAL HIGH (ref 44–121)
BUN/Creatinine Ratio: 14 (ref 12–28)
BUN: 10 mg/dL (ref 8–27)
Bilirubin Total: 0.4 mg/dL (ref 0.0–1.2)
Calcium: 9.3 mg/dL (ref 8.7–10.3)
Chloride: 105 mmol/L (ref 96–106)
Creatinine, Ser: 0.71 mg/dL (ref 0.57–1.00)
Globulin, Total: 2.7 g/dL (ref 1.5–4.5)
Glucose: 100 mg/dL — ABNORMAL HIGH (ref 70–99)
Potassium: 4.1 mmol/L (ref 3.5–5.2)
Sodium: 142 mmol/L (ref 134–144)
Total Protein: 6.9 g/dL (ref 6.0–8.5)
eGFR: 97 mL/min/{1.73_m2} (ref >59)

## 2023-05-01 LAB — VITAMIN B12: Vitamin B-12: 456 pg/mL (ref 232–1245)

## 2023-05-01 LAB — MAGNESIUM: Magnesium: 2.2 mg/dL (ref 1.6–2.3)

## 2023-05-01 LAB — C-REACTIVE PROTEIN: CRP: 9 mg/L (ref 0–10)

## 2023-05-01 LAB — SEDIMENTATION RATE: Sed Rate: 35 mm/h (ref 0–40)

## 2023-05-04 ENCOUNTER — Other Ambulatory Visit: Payer: Self-pay | Admitting: Family Medicine

## 2023-05-04 DIAGNOSIS — E119 Type 2 diabetes mellitus without complications: Secondary | ICD-10-CM

## 2023-05-04 NOTE — Telephone Encounter (Signed)
Medication Refill -  Most Recent Primary Care Visit:  Provider: Sherlyn Hay  Department: [facility]  Visit Type: NEW PATIENT  Date: 03/23/2023  Medication: Semaglutide, 1 MG/DOSE, (OZEMPIC, 1 MG/DOSE,) 4 MG/3ML SOPN   valsartan (DIOVAN) 320 MG tablet   Has the patient contacted their pharmacy? Yes Is this the correct pharmacy for this prescription? Yes If no, delete pharmacy and type the correct one.  This is the patient's preferred pharmacy:  [facility] [address], [place] - [address] [place] [address] Phone: [phone] Fax: [phone]   Has the prescription been filled recently? No  Is the patient out of the medication? Yes  Has the patient been seen for an appointment in the last year OR does the patient have an upcoming appointment? Yes  Can we respond through MyChart? Yes  Agent: Please be advised that Rx refills may take up to 3 business days. We ask that you follow-up with your pharmacy.

## 2023-05-05 NOTE — Telephone Encounter (Signed)
Requested medications are due for refill today.  Unsure  - both are historical  Requested medications are on the active medications list.  yes  Last refill. varied  Future visit scheduled.   yes  Notes to clinic.  Both medications are historical. Please review for refill.    Requested Prescriptions  Pending Prescriptions Disp Refills   Semaglutide, 1 MG/DOSE, (OZEMPIC, 1 MG/DOSE,) 4 MG/3ML SOPN      Sig: Inject into the skin.     Endocrinology:  Diabetes - GLP-1 Receptor Agonists - semaglutide Failed - 05/05/2023 11:07 AM      Failed - HBA1C in normal range and within 180 days    Hgb A1c MFr Bld  Date Value Ref Range Status  03/23/2023 7.7 (H) 4.8 - 5.6 % Final    Comment:             Prediabetes: 5.7 - 6.4          Diabetes: >6.4          Glycemic control for adults with diabetes: <7.0          Passed - Cr in normal range and within 360 days    Creatinine  Date Value Ref Range Status  12/28/2013 0.90 0.60 - 1.30 mg/dL Final   Creatinine, Ser  Date Value Ref Range Status  04/20/2023 0.71 0.57 - 1.00 mg/dL Final         Passed - Valid encounter within last 6 months    Recent Outpatient Visits           1 month ago Interstitial lung disease [facility])   [place] [facility] Pardue, Monico Blitz, DO   5 years ago Cervical radiculopathy   [facility] Lada, Janit Bern, MD   5 years ago Acute pain of right shoulder   [facility] Lada, Janit Bern, MD   5 years ago Acute non-recurrent maxillary sinusitis   [place] [facility] Lada, Janit Bern, MD   5 years ago Facet arthritis, degenerative, lumbar spine   [facility] Lada, Janit Bern, MD       Future Appointments             In 2 weeks Pardue, Monico Blitz, DO [facility]             valsartan (DIOVAN) 320 MG tablet      Sig: Take by mouth.     Cardiovascular:  Angiotensin  Receptor Blockers Failed - 05/05/2023 11:07 AM      Failed - Last BP in normal range    BP Readings from Last 1 Encounters:  04/20/23 (!) 154/104         Passed - Cr in normal range and within 180 days    Creatinine  Date Value Ref Range Status  12/28/2013 0.90 0.60 - 1.30 mg/dL Final   Creatinine, Ser  Date Value Ref Range Status  04/20/2023 0.71 0.57 - 1.00 mg/dL Final         Passed - K in normal range and within 180 days    Potassium  Date Value Ref Range Status  04/20/2023 4.1 3.5 - 5.2 mmol/L Final  12/28/2013 4.1 3.5 - 5.1 mmol/L Final         Passed - Patient is not pregnant      Passed - Valid encounter within last 6 months    Recent Outpatient Visits  1 month ago Interstitial lung disease [facility])   [place] [facility] Pardue, Monico Blitz, DO   5 years ago Cervical radiculopathy   [facility] Lada, Janit Bern, MD   5 years ago Acute pain of right shoulder   [facility]ada, Janit Bern, MD   5 years ago Acute non-recurrent maxillary sinusitis   [place] [facility] Lada, Janit Bern, MD   5 years ago Facet arthritis, degenerative, lumbar spine   [facility] Lada, Janit Bern, MD       Future Appointments             In 2 weeks Pardue, Monico Blitz, DO [place] [facility]

## 2023-05-08 MED ORDER — OZEMPIC (1 MG/DOSE) 4 MG/3ML ~~LOC~~ SOPN: 1.0000 mg | PEN_INJECTOR | SUBCUTANEOUS | 0 refills | Status: AC

## 2023-05-19 ENCOUNTER — Encounter: Payer: Self-pay | Admitting: Family Medicine

## 2023-05-19 ENCOUNTER — Ambulatory Visit (INDEPENDENT_AMBULATORY_CARE_PROVIDER_SITE_OTHER): Payer: Medicaid Other | Admitting: Family Medicine

## 2023-05-19 VITALS — BP 148/77 | HR 100 | Resp 18 | Ht 63.0 in | Wt 217.0 lb

## 2023-05-19 DIAGNOSIS — I10 Essential (primary) hypertension: Secondary | ICD-10-CM | POA: Diagnosis not present

## 2023-05-19 DIAGNOSIS — Z0001 Encounter for general adult medical examination with abnormal findings: Secondary | ICD-10-CM

## 2023-05-19 DIAGNOSIS — G479 Sleep disorder, unspecified: Secondary | ICD-10-CM | POA: Diagnosis not present

## 2023-05-19 DIAGNOSIS — E1149 Type 2 diabetes mellitus with other diabetic neurological complication: Secondary | ICD-10-CM | POA: Diagnosis not present

## 2023-05-19 DIAGNOSIS — R052 Subacute cough: Secondary | ICD-10-CM

## 2023-05-19 DIAGNOSIS — Z716 Tobacco abuse counseling: Secondary | ICD-10-CM | POA: Diagnosis not present

## 2023-05-19 DIAGNOSIS — Z7985 Long-term (current) use of injectable non-insulin antidiabetic drugs: Secondary | ICD-10-CM | POA: Diagnosis not present

## 2023-05-19 DIAGNOSIS — R351 Nocturia: Secondary | ICD-10-CM | POA: Diagnosis not present

## 2023-05-19 DIAGNOSIS — E559 Vitamin D deficiency, unspecified: Secondary | ICD-10-CM | POA: Diagnosis not present

## 2023-05-19 DIAGNOSIS — J849 Interstitial pulmonary disease, unspecified: Secondary | ICD-10-CM | POA: Diagnosis not present

## 2023-05-19 DIAGNOSIS — Z0000 Encounter for general adult medical examination without abnormal findings: Secondary | ICD-10-CM | POA: Insufficient documentation

## 2023-05-19 MED ORDER — GUAIFENESIN ER 600 MG PO TB12
600.0000 mg | ORAL_TABLET | Freq: Two times a day (BID) | ORAL | 0 refills | Status: AC
Start: 2023-05-19 — End: ?

## 2023-05-19 MED ORDER — VITAMIN D (ERGOCALCIFEROL) 1.25 MG (50000 UNIT) PO CAPS: 50000.0000 [IU] | ORAL_CAPSULE | ORAL | 1 refills | Status: AC

## 2023-05-19 NOTE — Assessment & Plan Note (Signed)
 Diet includes high sugar yogurt. No exercise due to back pain. Discussed switching to lower sugar yogurt options and trying chair exercises for physical activity. - Switch to lower sugar yogurt options like Activia or Greek yogurt - Try chair exercises for physical activity; handout with chair exercise examples given.

## 2023-05-19 NOTE — Assessment & Plan Note (Signed)
Persistent cough with sputum production post-antibiotic treatment. Likely residual mucus. Discussed using Mucinex and deep breathing exercises to clear mucus. - Prescribe Mucinex for 15 days - Encourage deep breathing exercises to clear mucus

## 2023-05-19 NOTE — Assessment & Plan Note (Signed)
 Low vitamin D  levels noted in recent blood work. Discussed prescribing vitamin D  once a week for six months and rechecking levels after six months. Explained the importance of maintaining adequate vitamin D  levels for mood and pain sensation. - Prescribe vitamin D  once a week for six months - Recheck vitamin D  levels after six months

## 2023-05-19 NOTE — Assessment & Plan Note (Addendum)
 Difficulty falling asleep until 1-2 AM, frequent nocturnal awakenings to urinate. High coffee intake, including at night, likely contributing. Discussed reducing coffee intake, especially at night, and switching to decaf to improve sleep quality. - Reduce coffee intake, especially at night - Consider switching to decaf coffee

## 2023-05-19 NOTE — Assessment & Plan Note (Addendum)
 BP mildly elevated today; however, patient is experiencing persistent back pain today, which may have caused the elevation.  Will reassess on follow-up visit. Continue valsartan 325 mg daily Continue hydrochlorothiazide  12.5 mg daily Continue amlodipine 10 mg daily

## 2023-05-19 NOTE — Assessment & Plan Note (Deleted)
 Difficulty falling asleep until 1-2 AM, frequent nocturnal awakenings to urinate. High coffee intake, including at night, likely contributing. Discussed reducing coffee intake, especially at night, and switching to decaf to improve sleep quality. - Reduce coffee intake, especially at night - Consider switching to decaf coffee

## 2023-05-19 NOTE — Assessment & Plan Note (Addendum)
 Physical exam overall unremarkable except as noted above.  Lab work up-to-date.  Due for several vaccinations and screenings. Discussed the importance of COVID booster, pneumonia vaccine, tetanus vaccine, and shingles vaccine. Recommended scheduling a mammogram and discussing colonoscopy with GI doctor on January 27th. - Recommend COVID booster - Recommend pneumonia vaccine - Recommend tetanus vaccine - Recommend shingles vaccine - Schedule mammogram - Discuss colonoscopy with GI doctor on January 27th

## 2023-05-19 NOTE — Assessment & Plan Note (Signed)
 Frequent nighttime urination, likely exacerbated by high fluid and coffee intake. Discussed reducing fluid intake in the evening and switching to decaf coffee. - Reduce fluid intake in the evening - Switch to decaf coffee

## 2023-05-19 NOTE — Progress Notes (Signed)
 Complete physical exam   Patient: Brittany Castro   DOB: July 13, 1960   62 y.o. Female  MRN: [id number] Visit Date: 05/19/2023  Today's healthcare provider: LAURAINE LOISE BUOY, DO   Chief Complaint  Patient presents with   Annual Exam   Hypertension   Subjective    Brittany Castro is a 62 y.o. female who presents today for a complete physical exam.  She reports consuming a general diet. The patient does not participate in regular exercise at present. She generally feels fairly well. She reports sleeping poorly, as she has a hard time falling asleep. She wakes up 3-5 times per night to go to the bathroom.  She does have additional problems to discuss today.  HPI  Last PAP smear: 04/01/2023 - NILM, HPV negative  Ordered referral to gastroenterology at previous visit -scheduled for 06/15/2023 Next pain management appointment 06/03/2023   The patient, with a history of chronic back pain and diabetes, presents for a routine follow-up. The primary concern remains the persistent back pain, which has not improved despite recent initiation of pain management. A Lumbar spine CT was recently performed, with results pending at a follow-up appointment with the pain management specialist.  The patient reports difficulty sleeping, often not falling asleep until the early hours of the morning (usually 1 or 2 AM). Despite this, she maintains a consistent wake-up time (9 or 10 AM), resulting in a substantial amount of sleep each night (at least 8 hours). However, the quality of sleep is disrupted by frequent nocturnal urination, which the patient attributes to high fluid intake, including significant coffee consumption.  The patient's diet has seen some improvements, with the introduction of yogurt and granola. However, she expresses a preference for Yoplait, which is high in sugar. The patient has not tried Greek yogurt but is open to doing so. Exercise is limited due to the chronic back pain, but the patient  expresses willingness to try chair exercises.  The patient also reports a recent episode of illness, characterized by a persistent cough and mucus production. Over-the-counter medications provided no relief, leading to a visit to acute care where antibiotics and cough medicine were prescribed. Despite this, the patient continues to cough up mucus, albeit much less frequently.  The patient also mentions occasional episodes of severe nausea, with a painful sensation in her abdomen. These episodes have decreased in frequency but remain a concern. The patient is scheduled to see a gastroenterologist for further evaluation.  The patient is a long-term smoker, currently consuming less than half a pack per day, down from over a pack per day. She has tried to quit in the past but was unsuccessful. The patient is also on Ozempic  for diabetes management, which she reports has significantly curbed her appetite. However, she ran out of the medication for approximately two weeks and just restarted yesterday.  She denies adverse symptoms in relation to this.  The patient also reports occasional feelings of a clogged ear, which has been attributed to sinus allergies. She has not been prescribed an allergy medication. 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 This has been an ongoing issue, with a previous intervention to stretch the esophagus over twenty years ago.  As noted above, the patient is scheduled to see a gastroenterologist for further evaluation of this issue.   Past Medical History:  Diagnosis Date   Arthritis    Asthma    Benign hypertension 08/19/2016  Fatty liver 08/19/2016   Moderate to severe on CT scan March 2018   Hypertension    Tobacco abuse 08/19/2016   Past Surgical History:  Procedure Laterality Date   carpel tunnel     COLONOSCOPY WITH PROPOFOL  N/A 11/16/2014   Procedure: COLONOSCOPY WITH PROPOFOL ;  Surgeon: Rogelia Copping, MD;  Location: [facility] ENDOSCOPY;  Service: Endoscopy;  Laterality: N/A;   COLPOSCOPY     HERNIA REPAIR  2002   Dr Dessa   TENDON RELEASE     TUBAL LIGATION  2002   Social History   Socioeconomic History   Marital status: Divorced    Spouse name: Not on file   Number of children: Not on file   Years of education: Not on file   Highest education level: 12th grade  Occupational History   Not on file  Tobacco Use   Smoking status: Every Day    Current packs/day: 0.25    Average packs/day: 0.3 packs/day for 25.6 years (6.4 ttl pk-yrs)    Types: Cigarettes    Start date: 10/28/1997   Smokeless tobacco: Never  Vaping Use   Vaping status: Never Used  Substance and Sexual Activity   Alcohol use: No   Drug use: No   Sexual activity: Not Currently  Other Topics Concern   Not on file  Social History Narrative   Not on file   Social Drivers of Health   Financial Resource Strain: Patient Declined (04/06/2023)   Overall Financial Resource Strain (CARDIA)    Difficulty of Paying Living Expenses: Patient declined  Food Insecurity: Food Insecurity Present (04/06/2023)   Hunger Vital Sign    Worried About Running Out of Food in the Last Year: Patient declined    Ran Out of Food in the Last Year: Sometimes true  Transportation Needs: No Transportation Needs (04/06/2023)   PRAPARE - Administrator, Civil Service (Medical): No    Lack of Transportation (Non-Medical): No  Physical Activity: Unknown (04/06/2023)   Exercise Vital Sign    Days of Exercise per Week: 0 days    Minutes of Exercise per Session: Not on file  Stress: Stress Concern Present (04/06/2023)   [employer] of Occupational Health - Occupational Stress Questionnaire    Feeling of Stress : To some extent  Social Connections: Socially Isolated (04/06/2023)   Social Connection and Isolation Panel [NHANES]    Frequency of Communication with Friends and Family: More than three times a week     Frequency of Social Gatherings with Friends and Family: Twice a week    Attends Religious Services: Never    Database Administrator or Organizations: No    Attends Engineer, Structural: Not on file    Marital Status: Divorced  Intimate Partner Violence: Not on file   Family Status  Relation Name Status   Mother Wanda Margo Alive   Father Catarina Margo Deceased       cirohsis   Sister 1 Alive   Brother Building Surveyor Deceased       cirrhosis   Daughter 2 Alive   MGM  Deceased       heart attack   MGF  Deceased       old age   PGM  Deceased       DM   PGF  Deceased       old age  No partnership data on file   Family History  Problem Relation Age of Onset   Multiple  sclerosis Mother    Clotting disorder Mother    Hyperlipidemia Mother    Stroke Mother    Osteoporosis Mother    Alcohol abuse Father    Cirrhosis Father    Alcohol abuse Brother    Cirrhosis Brother    Heart attack Maternal Grandmother    Diabetes Paternal Grandmother    No Known Allergies  Patient Care Team: Rabab Currington, Lauraine SAILOR, DO as PCP - General (Family Medicine) Dessa, Reyes ORN, MD as Consulting Physician (General Surgery) Hinton Newell SQUIBB, MD as Referring Physician (Family Medicine)   Medications: Outpatient Medications Prior to Visit  Medication Sig   acetaminophen (TYLENOL) 500 MG tablet Take 500 mg by mouth every 6 (six) hours as needed. Takes about 4 a day   albuterol  (ACCUNEB ) 0.63 MG/3ML nebulizer solution Take 1 ampule by nebulization every 6 (six) hours as needed for wheezing.   albuterol  (PROVENTIL ) (2.5 MG/3ML) 0.083% nebulizer solution Inhale into the lungs.   albuterol  (VENTOLIN  HFA) 108 (90 Base) MCG/ACT inhaler Inhale 2-4 puffs by mouth every 4 hours as needed for wheezing, cough, and/or shortness of breath   amLODipine (NORVASC) 10 MG tablet Take 1 tablet by mouth daily.   aspirin EC 81 MG tablet Take 1 tablet (81 mg total) by mouth daily.   atorvastatin  (LIPITOR) 40 MG tablet Take by mouth.   celecoxib (CELEBREX) 100 MG capsule Take 100 mg by mouth 2 (two) times daily.   celecoxib (CELEBREX) 100 MG capsule Take by mouth.   docusate sodium (COLACE) 100 MG capsule Take 100 mg by mouth 2 (two) times daily.   doxycycline  (VIBRA -TABS) 100 MG tablet Take 1 tablet by mouth 2 (two) times daily.   fluticasone  (FLONASE ) 50 MCG/ACT nasal spray Place 1 spray into both nostrils daily. (Patient taking differently: Place 1 spray into both nostrils as needed.)   fluticasone  (FLONASE ) 50 MCG/ACT nasal spray Use 2 sprays into each nostril daily.   hydrochlorothiazide  (MICROZIDE ) 12.5 MG capsule TAKE 1 CAPSULE BY MOUTH EVERY DAY   ipratropium-albuterol  (DUONEB) 0.5-2.5 (3) MG/3ML SOLN Take 3 mLs by nebulization.   ipratropium-albuterol  (DUONEB) 0.5-2.5 (3) MG/3ML SOLN Inhale into the lungs.   Naltrexone -buPROPion  HCl ER 8-90 MG TB12 Contrave  8 mg-90 mg tablet,extended release   rosuvastatin  (CRESTOR ) 40 MG tablet Take 1 tablet (40 mg total) by mouth daily.   Semaglutide , 1 MG/DOSE, (OZEMPIC , 1 MG/DOSE,) 4 MG/3ML SOPN Inject 1 mg into the skin once a week. Inject into the skin.   valsartan (DIOVAN) 320 MG tablet Take by mouth.   [DISCONTINUED] naproxen (NAPROSYN) 250 MG tablet Take 250 mg by mouth 2 (two) times daily.   No facility-administered medications prior to visit.      Objective    BP (!) 148/77   Pulse 100   Resp 18   Ht 5' 3 (1.6 m)   Wt 217 lb (98.4 kg)   SpO2 100%   BMI 38.44 kg/m    Physical Exam Vitals and nursing note reviewed.  Constitutional:      General: She is awake.     Appearance: Normal appearance.  HENT:     Head: Normocephalic and atraumatic.     Right Ear: Tympanic membrane, ear canal and external ear normal.     Left Ear: Tympanic membrane, ear canal and external ear normal.     Nose: Nose normal.     Mouth/Throat:     Mouth: Mucous membranes are moist.     Pharynx: Oropharynx is clear. No oropharyngeal exudate  or  posterior oropharyngeal erythema.  Eyes:     General: No scleral icterus.    Extraocular Movements: Extraocular movements intact.     Conjunctiva/sclera: Conjunctivae normal.     Pupils: Pupils are equal, round, and reactive to light.  Neck:     Thyroid : No thyromegaly or thyroid  tenderness.  Cardiovascular:     Rate and Rhythm: Normal rate and regular rhythm.     Pulses: Normal pulses.          Dorsalis pedis pulses are 2+ on the right side and 2+ on the left side.       Posterior tibial pulses are 2+ on the right side and 2+ on the left side.     Heart sounds: Normal heart sounds.  Pulmonary:     Effort: Pulmonary effort is normal. No tachypnea, bradypnea or respiratory distress.     Breath sounds: Normal breath sounds. No stridor. No wheezing, rhonchi or rales.  Abdominal:     General: Bowel sounds are normal. There is no distension.     Palpations: Abdomen is soft. There is no mass.     Tenderness: There is no abdominal tenderness. There is no guarding.     Hernia: No hernia is present.  Musculoskeletal:     Cervical back: Normal range of motion and neck supple.     Right lower leg: No edema.     Left lower leg: No edema.     Right foot: Normal range of motion. No deformity, bunion, Charcot foot, foot drop or prominent metatarsal heads.     Left foot: Normal range of motion. No deformity, bunion, Charcot foot, foot drop or prominent metatarsal heads.  Feet:     Right foot:     Protective Sensation: 10 sites tested.  10 sites sensed.     Skin integrity: Callus present. No ulcer, blister, skin breakdown, erythema, warmth, dry skin or fissure.     Toenail Condition: Right toenails are long.     Left foot:     Protective Sensation: 10 sites tested.  10 sites sensed.     Skin integrity: Callus present. No ulcer, blister, skin breakdown, erythema, warmth, dry skin or fissure.     Toenail Condition: Left toenails are long.  Lymphadenopathy:     Cervical: No cervical adenopathy.   Skin:    General: Skin is warm and dry.  Neurological:     Mental Status: She is alert and oriented to person, place, and time. Mental status is at baseline.  Psychiatric:        Mood and Affect: Mood normal.        Behavior: Behavior normal.      Last depression screening scores    05/19/2023    9:06 AM 04/20/2023    9:39 AM 03/23/2023   10:19 AM  PHQ 2/9 Scores  PHQ - 2 Score 4 6 5   PHQ- 9 Score 16 22 21    Last fall risk screening    05/19/2023    9:45 AM  Fall Risk   Falls in the past year? 0  Number falls in past yr: 0  Injury with Fall? 0   Last Audit-C alcohol use screening    05/19/2023    9:07 AM  Alcohol Use Disorder Test (AUDIT)  1. How often do you have a drink containing alcohol? 0  2. How many drinks containing alcohol do you have on a typical day when you are drinking? 0  3. How often do you  have six or more drinks on one occasion? 0  AUDIT-C Score 0   A score of 3 or more in women, and 4 or more in men indicates increased risk for alcohol abuse, EXCEPT if all of the points are from question 1   No results found for any visits on 05/19/23.  Assessment & Plan    Routine Health Maintenance and Physical Exam  Exercise Activities and Dietary recommendations  Goals   None     Immunization History  Administered Date(s) Administered   PFIZER Comirnaty(Gray Top)Covid-19 Tri-Sucrose Vaccine 01/16/2021    Health Maintenance  Topic Date Due   OPHTHALMOLOGY EXAM  Never done   MAMMOGRAM  05/21/2023 (Originally 01/15/2019)   Colonoscopy  06/02/2023 (Originally 11/16/2019)   DTaP/Tdap/Td (1 - Tdap) 06/05/2023 (Originally 09/29/1980)   Zoster Vaccines- Shingrix (1 of 2) 06/05/2023 (Originally 09/29/1980)   INFLUENZA VACCINE  08/17/2023 (Originally 12/18/2022)   COVID-19 Vaccine (2 - Pfizer risk series) 08/17/2023 (Originally 02/06/2021)   Pneumococcal Vaccine 34-54 Years old (1 of 2 - PCV) 05/18/2024 (Originally 09/30/1967)   HEMOGLOBIN A1C  09/20/2023    Diabetic kidney evaluation - Urine ACR  03/22/2024   Diabetic kidney evaluation - eGFR measurement  04/19/2024   FOOT EXAM  05/18/2024   Cervical Cancer Screening (HPV/Pap Cotest)  03/31/2028   Hepatitis C Screening  Completed   HPV VACCINES  Aged Out   HIV Screening  Discontinued    Discussed health benefits of physical activity, and encouraged her to engage in regular exercise appropriate for her age and condition.   Annual physical exam Assessment & Plan: Physical exam overall unremarkable except as noted above.  Lab work up-to-date.  Due for several vaccinations and screenings. Discussed the importance of COVID booster, pneumonia vaccine, tetanus vaccine, and shingles vaccine. Recommended scheduling a mammogram and discussing colonoscopy with GI doctor on January 27th. - Recommend COVID booster - Recommend pneumonia vaccine - Recommend tetanus vaccine - Recommend shingles vaccine - Schedule mammogram - Discuss colonoscopy with GI doctor on January 27th    Vitamin D  deficiency Assessment & Plan: Low vitamin D  levels noted in recent blood work. Discussed prescribing vitamin D  once a week for six months and rechecking levels after six months. Explained the importance of maintaining adequate vitamin D  levels for mood and pain sensation. - Prescribe vitamin D  once a week for six months - Recheck vitamin D  levels after six months  Orders: -     Vitamin D  (Ergocalciferol ); Take 1 capsule (50,000 Units total) by mouth every 7 (seven) days.  Dispense: 12 capsule; Refill: 1  Primary hypertension Assessment & Plan: BP mildly elevated today; however, patient is experiencing persistent back pain today, which may have caused the elevation.  Will reassess on follow-up visit. 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 Continue semaglutide  1 mg weekly.  Will increase to 2 mg weekly patient tolerates this well. Continue rosuvastatin  40 mg nightly   Subacute cough Assessment & Plan: Persistent cough with sputum production post-antibiotic treatment. Likely residual mucus. Discussed using Mucinex  and deep breathing exercises to clear mucus. - Prescribe Mucinex  for 15 days - Encourage deep breathing exercises to clear mucus  Orders: -     guaiFENesin  ER; Take 1 tablet (600 mg total) by mouth 2 (two) times daily.  Dispense: 30 tablet; Refill: 0  Interstitial lung disease (HCC) Assessment &  Plan: Stable.   Continue DuoNebs and albuterol  via nebulizer as prescribed Continue albuterol  inhaler as needed  Orders: -     guaiFENesin  ER; Take 1 tablet (600 mg total) by mouth 2 (two) times daily.  Dispense: 30 tablet; Refill: 0  Nocturia Assessment & Plan: Frequent nighttime urination, likely exacerbated by high fluid and coffee intake. Discussed reducing fluid intake in the evening and switching to decaf coffee. - Reduce fluid intake in the evening - Switch to decaf coffee   Sleep disturbance Assessment & Plan: Difficulty falling asleep until 1-2 AM, frequent nocturnal awakenings to urinate. High coffee intake, including at night, likely contributing. Discussed reducing coffee intake, especially at night, and switching to decaf to improve sleep quality. - Reduce coffee intake, especially at night - Consider switching to decaf coffee   Severe obesity (BMI 35.0-39.9) with comorbidity (HCC) Assessment & Plan: Diet includes high sugar yogurt. No exercise due to back pain. Discussed switching to lower sugar yogurt options and trying chair exercises for physical activity. - Switch to lower sugar yogurt options like Activia or Greek yogurt - Try chair exercises for physical activity; handout with chair exercise examples given.   Encounter for smoking cessation counseling Assessment &  Plan: Long-term smoker, currently smoking less than a pack a day. Previous unsuccessful quit attempt. Discussed smoking cessation options and support. - Discuss smoking cessation options and support - Patient will be continuing to try to reduce her smoking on her own.    Return in about 6 weeks (around 06/30/2023) for chronic f/u.   - Contact breast center for mammogram scheduling.  I discussed the assessment and treatment plan with the patient  The patient was provided an opportunity to ask questions and all were answered. The patient agreed with the plan and demonstrated an understanding of the instructions.   The patient was advised to call back or seek an in-person evaluation if the symptoms worsen or if the condition fails to improve as anticipated.  Total time was 45 minutes. That includes chart review before the visit, the actual patient visit, and time spent on documentation after the visit.    LAURAINE LOISE BUOY, DO  [facility] [phone] (phone) [phone] (fax)  [facility]

## 2023-05-19 NOTE — Assessment & Plan Note (Signed)
Stable.   Continue DuoNebs and albuterol via nebulizer as prescribed Continue albuterol inhaler as needed

## 2023-05-19 NOTE — Assessment & Plan Note (Signed)
 Long-term smoker, currently smoking less than a pack a day. Previous unsuccessful quit attempt. Discussed smoking cessation options and support. - Discuss smoking cessation options and support - Patient will be continuing to try to reduce her smoking on her own.

## 2023-05-19 NOTE — Patient Instructions (Signed)
 Please call the [facility] [phone]) to schedule a routine screening mammogram.

## 2023-05-19 NOTE — Assessment & Plan Note (Addendum)
 Stable. Continue semaglutide 1 mg weekly.  Will increase to 2 mg weekly patient tolerates this well. Continue rosuvastatin 40 mg nightly

## 2023-06-02 DIAGNOSIS — M5459 Other low back pain: Secondary | ICD-10-CM | POA: Insufficient documentation

## 2023-06-02 DIAGNOSIS — M47816 Spondylosis without myelopathy or radiculopathy, lumbar region: Secondary | ICD-10-CM | POA: Insufficient documentation

## 2023-06-02 NOTE — Progress Notes (Deleted)
 PROVIDER NOTE: Information contained herein reflects review and annotations entered in association with encounter. Interpretation of such information and data should be left to medically-trained personnel. Information provided to patient can be located elsewhere in the medical record under "Patient Instructions". Document created using STT-dictation technology, any transcriptional errors that may result from process are unintentional.    Patient: Brittany Castro  Service Category: E/M  Provider: Candi Chafe, MD  DOB: November 02, 1960  DOS: 06/03/2023  Referring Provider: Carlean Charter DO  MRN: [id number]  Specialty: Interventional Pain Management  PCP: Carlean Charter, DO  Type: Established Patient  Setting: Ambulatory outpatient    Location: Office  Delivery: Face-to-face     Primary Reason(s) for Visit: Encounter for evaluation before starting new chronic pain management plan of care (Level of risk: moderate) CC: No chief complaint on file.  HPI  Brittany Castro is a 63 y.o. year old, female patient, who comes today for a follow-up evaluation to review the test results and decide on a treatment plan. She has Special screening for malignant neoplasms, colon; Benign neoplasm of ascending colon; Benign neoplasm of transverse colon; Benign neoplasm of sigmoid colon; Rectal polyp; Cold sore; Right upper quadrant abdominal pain; Fatty liver; Primary hypertension; Tobacco abuse; Abdominal bloating; Redundant colon; Aortic atherosclerosis (HCC); Interstitial lung disease (HCC); Severe obesity (BMI 35.0-39.9) with comorbidity (HCC); Degenerative disc disease, lumbar; Facet arthritis, degenerative, lumbar spine; AC (acromioclavicular) arthritis; Postmenopausal bleeding; High risk medication use; Esophageal dysphagia; 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); Lumbar facet arthropathy; History of abnormal cervical Pap smear; Depression, major, single episode, severe (HCC); Muscle  strain of lower extremity; Arthralgia of multiple sites; Chronic low back pain (1ry area of Pain) (Right) w/o sciatica; COVID-19; Dysfunction of left eustachian tube; Tibialis posterior tendinitis; Lumbar spondylosis; Healthcare maintenance; Chronic pain syndrome; Pharmacologic therapy; Disorder of skeletal system; Problems influencing health status; Abnormal MRI, lumbar spine (12/05/2021); Grade 1 Anterolisthesis of lumbar spine (L4/L5); Chronic thoracic back pain (3ry area of Pain) (Right); Chronic hip pain (2ry area of Pain) (Right); Physical deconditioning; Decreased range of motion of lumbar spine; Numbness and tingling of hand (intermittent) (Left); Thoracic radicular pain (Right); Annual physical exam; Vitamin D  deficiency; Nocturia; Sleep disturbance; Subacute cough; Encounter for smoking cessation counseling; Lumbar facet joint pain; Lumbar facet hypertrophy; and Lumbar facet joint syndrome on their problem list. Her primarily concern today is the No chief complaint on file.  Pain Assessment: Location:     Radiating:   Onset:   Duration:   Quality:   Severity:  /10 (subjective, self-reported pain score)  Effect on ADL:   Timing:   Modifying factors:   BP:    HR:    Brittany Castro comes in today for a follow-up visit after her initial evaluation on 04/20/2023. Today we went over the results of her tests. These were explained in "Layman's terms". During today's appointment we went over my diagnostic impression, as well as the proposed treatment plan.  Review of initial evaluation (04/20/2023): "The patient, with a history of chronic low back pain for approximately three to four years, presents for further evaluation. The pain is localized to the right side of the lower back, with occasional discomfort on the left. The patient has undergone multiple diagnostic procedures, including MRIs and x-rays, and has received injections for pain management. The most recent MRI, conducted in July 2023,  revealed a grade one anterolisthesis of L4 over L5, moderate facet arthropathy at L4-5 and L5-S1 levels, and  mild multilevel degenerative changes. The patient denies any history of back surgeries or physical therapy.   The patient also reports a sensation of 'restless legs,' predominantly in the right leg, which is described as an inability to keep the leg still, particularly in the evenings. There is no associated pain, numbness, or weakness in the lower extremities.   In addition to the back pain, the patient experiences discomfort in the right hip and a tingling sensation in the left hand, described as the hand 'going to sleep.' This numbness affects the entire hand, predominantly in the thumb area, and is most noticeable at night, particularly when sleeping on the left side. The patient has a history of carpal tunnel surgery on the right hand and tendonitis in the left hand.   The patient also reports intermittent right upper quadrant abdominal pain, which can occur at any time and is described as extremely painful. The pain appears to radiate to the right flank and the right side of the upper back. Despite previous investigations, including a CT scan, the cause of this abdominal pain remains undetermined. The patient denies any recent falls but mentions a history of 'slipping' without actual falls.   The patient has not been working since 2019 and reports limited physical activity, primarily due to the persistent pain. The patient's daily routine involves minimal movement, with most of the day spent sitting with a pillow for back support."  Review of diagnostic studies ordered on 04/20/2023: Lab work: C-reactive protein, magnesium, sed rate, and vitamin B12 levels were all within normal limits.  The comprehensive metabolic panel showed an elevated alkaline phosphatase level of 154 international units/L (normal 44-1 21) with a mildly elevated glucose level, however the patient was not n.p.o. at the  time of the lab work.  Vitamin D  levels were shown to be significantly decreased at 8.8 ng/mL (normal 30-100) demonstrating a vitamin D  deficiency. Diagnostic imaging: Diagnostic x-rays of the thoracic and lumbar spine with bending views were not done.  CT of the lumbar spine shows lower lumbar facet osteoarthritis specially at the L4-5 level where there is an anterolisthesis.  No bony impingement seen throughout the lumbar spine. Physical therapy was also ordered on 04/20/2023.  Discussed the use of AI scribe software for clinical note transcription with the patient, who gave verbal consent to proceed.  History of Present Illness          Patient presented with interventional treatment options. Ms. Lightle was informed that I will not be providing medication management. Pharmacotherapy evaluation including recommendations may be offered, if specifically requested.   Controlled Substance Pharmacotherapy Assessment REMS (Risk Evaluation and Mitigation Strategy)  Opioid Analgesic: None MME/day: 0 mg/day  Pill Count: None expected due to no prior prescriptions written by our practice. No notes on file Pharmacokinetics: Liberation and absorption (onset of action): WNL Distribution (time to peak effect): WNL Metabolism and excretion (duration of action): WNL         Pharmacodynamics: Desired effects: Analgesia: Ms. Karan reports >50% benefit. Functional ability: Patient reports that medication allows her to accomplish basic ADLs Clinically meaningful improvement in function (CMIF): Sustained CMIF goals met Perceived effectiveness: Described as relatively effective, allowing for increase in activities of daily living (ADL) Undesirable effects: Side-effects or Adverse reactions: None reported Monitoring:  PMP: PDMP reviewed during this encounter. Online review of the past 61-month period previously conducted. Not applicable at this point since we have not taken over the patient's medication  management yet. List of other Serum/Urine  Drug Screening Test(s):  No results found for: "AMPHSCRSER", "BARBSCRSER", "BENZOSCRSER", "COCAINSCRSER", "COCAINSCRNUR", "PCPSCRSER", "THCSCRSER", "THCU", "CANNABQUANT", "OPIATESCRSER", "OXYSCRSER", "PROPOXSCRSER", "ETH", "CBDTHCR", "D8THCCBX", "D9THCCBX" List of all UDS test(s) done:  Lab Results  Component Value Date   SUMMARY FINAL 04/20/2023   Last UDS on record: Summary  Date Value Ref Range Status  04/20/2023 FINAL  Final    Comment:    ==================================================================== Compliance Drug Analysis, Ur ==================================================================== Test                             Result       Flag       Units  Drug Present and Declared for Prescription Verification   Acetaminophen                  PRESENT      EXPECTED  Drug Present not Declared for Prescription Verification   Doxylamine                     PRESENT      UNEXPECTED   Dextromethorphan               PRESENT      UNEXPECTED   Dextrorphan/Levorphanol        PRESENT      UNEXPECTED    Dextrorphan is an expected metabolite of dextromethorphan, an over-    the-counter or prescription cough suppressant. Dextrorphan cannot be    distinguished from the scheduled prescription medication levorphanol    by the method used for analysis.  Drug Absent but Declared for Prescription Verification   Naltrexone                      Not Detected UNEXPECTED   Bupropion                       Not Detected UNEXPECTED   Salicylate                     Not Detected UNEXPECTED    Aspirin, as indicated in the declared medication list, is not always    detected even when used as directed.    Naproxen                       Not Detected UNEXPECTED ==================================================================== Test                      Result    Flag   Units      Ref Range   Creatinine              87               mg/dL       >=08 ==================================================================== Declared Medications:  The flagging and interpretation on this report are based on the  following declared medications.  Unexpected results may arise from  inaccuracies in the declared medications.   **Note: The testing scope of this panel includes these medications:   Bupropion   Naltrexone   Naproxen (Naprosyn)   **Note: The testing scope of this panel does not include small to  moderate amounts of these reported medications:   Acetaminophen (Tylenol)  Aspirin   **Note: The testing scope of this panel does not include the  following reported medications:   Albuterol  (AccuNeb )  Albuterol  (Duoneb)  Amlodipine (Norvasc)  Atorvastatin (Lipitor)  Celecoxib (Celebrex)  Docusate (Colace)  Doxycycline   Fluticasone  (Flonase )  Hydrochlorothiazide  (Microzide )  Ipratropium (Duoneb)  Rosuvastatin  (Crestor )  Semaglutide  (Ozempic )  Valsartan (Diovan) ==================================================================== For clinical consultation, please call [phone]. ====================================================================    UDS interpretation: No unexpected findings.          Medication Assessment Form: Not applicable. No opioids. Treatment compliance: Not applicable Risk Assessment Profile: Aberrant behavior: See initial evaluations. None observed or detected today Comorbid factors increasing risk of overdose: See initial evaluation. No additional risks detected today Opioid risk tool (ORT):     04/20/2023    9:38 AM  Opioid Risk   Alcohol 3  Illegal Drugs 0  Rx Drugs 0  Alcohol 0  Illegal Drugs 0  Rx Drugs 0  Age between 16-45 years  0  Psychological Disease 0  Depression 0  Opioid Risk Tool Scoring 3  Opioid Risk Interpretation Low Risk    ORT Scoring interpretation table:  Score <3 = Low Risk for SUD  Score between 4-7 = Moderate Risk for SUD  Score >8 = High Risk for  Opioid Abuse   Risk of substance use disorder (SUD): Low  Risk Mitigation Strategies:  Patient opioid safety counseling: No controlled substances prescribed. Patient-Prescriber Agreement (PPA): No agreement signed.  Controlled substance notification to other providers: None required. No opioid therapy.  Pharmacologic Plan: Non-opioid analgesic therapy offered. Interventional alternatives discussed.             Laboratory Chemistry Profile   Renal Lab Results  Component Value Date   BUN 10 04/20/2023   CREATININE 0.71 04/20/2023   BCR 14 04/20/2023   GFRAA >60 07/16/2018   GFRNONAA >60 07/16/2018   SPECGRAV 1.014 10/28/2017   PHUR 5.0 10/28/2017   PROTEINUR 100 (A) 07/16/2018     Electrolytes Lab Results  Component Value Date   NA 142 04/20/2023   K 4.1 04/20/2023   CL 105 04/20/2023   CALCIUM  9.3 04/20/2023   MG 2.2 04/20/2023     Hepatic Lab Results  Component Value Date   AST 21 04/20/2023   ALT 22 03/23/2023   ALBUMIN 4.2 04/20/2023   ALKPHOS 154 (H) 04/20/2023   LIPASE 33 07/16/2018     ID No results found for: "LYMEIGGIGMAB", "HIV", "SARSCOV2NAA", "STAPHAUREUS", "MRSAPCR", "HCVAB", "PREGTESTUR", "RMSFIGG", "QFVRPH1IGG", "QFVRPH2IGG"   Bone Lab Results  Component Value Date   25OHVITD1 8.8 (L) 04/20/2023   25OHVITD2 <1.0 04/20/2023   25OHVITD3 8.5 04/20/2023     Endocrine Lab Results  Component Value Date   GLUCOSE 100 (H) 04/20/2023   GLUCOSEU NEGATIVE 07/16/2018   HGBA1C 7.7 (H) 03/23/2023     Neuropathy Lab Results  Component Value Date   VITAMINB12 456 04/20/2023   HGBA1C 7.7 (H) 03/23/2023     CNS No results found for: "COLORCSF", "APPEARCSF", "RBCCOUNTCSF", "WBCCSF", "POLYSCSF", "LYMPHSCSF", "EOSCSF", "PROTEINCSF", "GLUCCSF", "JCVIRUS", "CSFOLI", "IGGCSF", "LABACHR", "ACETBL"   Inflammation (CRP: Acute  ESR: Chronic) Lab Results  Component Value Date   CRP 9 04/20/2023   ESRSEDRATE 35 04/20/2023     Rheumatology No results found  for: "RF", "ANA", "LABURIC", "URICUR", "LYMEIGGIGMAB", "LYMEABIGMQN", "HLAB27"   Coagulation Lab Results  Component Value Date   APTT 26.3 12/28/2013   PLT 223 07/16/2018     Cardiovascular Lab Results  Component Value Date   CKTOTAL 95 12/28/2013   CKMB 0.6 12/28/2013   TROPONINI <0.03 08/14/2016   HGB 14.8 07/16/2018   HCT 43.0 07/16/2018     Screening No results found for: "SARSCOV2NAA", "COVIDSOURCE", "STAPHAUREUS", "MRSAPCR", "  HCVAB", "HIV", "PREGTESTUR"   Cancer No results found for: "CEA", "CA125", "LABCA2"   Allergens No results found for: "ALMOND", "APPLE", "ASPARAGUS", "AVOCADO", "BANANA", "BARLEY", "BASIL", "BAYLEAF", "GREENBEAN", "LIMABEAN", "WHITEBEAN", "BEEFIGE", "REDBEET", "BLUEBERRY", "BROCCOLI", "CABBAGE", "MELON", "CARROT", "CASEIN", "CASHEWNUT", "CAULIFLOWER", "CELERY"     Note: Lab results reviewed.  Recent Diagnostic Imaging Review  Shoulder Imaging: Shoulder-R DG: Results for orders placed during the hospital encounter of 02/12/18 DG Shoulder Right  Narrative CLINICAL DATA:  Five day history of right neck and right shoulder pain with no known injury. Decreased range of motion. History of arthritis.  EXAM: RIGHT SHOULDER - 2+ VIEW  COMPARISON:  Limited views of the right shoulder from a chest x-ray of November 02, 2017  FINDINGS: The bones are subjectively adequately mineralized. The glenohumeral joint space is well maintained. There is mild degenerative change of the [facility] joint. The subacromial subdeltoid space is well-maintained. There is no acute fracture or dislocation.  IMPRESSION: There is no acute bony abnormality of the right shoulder. There is mild degenerative change of the [facility] joint.   Electronically Signed By: David  [place] M.D. On: 02/15/2018 08:07  Lumbosacral Imaging: Lumbar CT wo contrast: Results for orders placed during the hospital encounter of 04/29/23 CT LUMBAR SPINE WO CONTRAST  Narrative CLINICAL DATA:  Low back pain  with symptoms persisting over 6 weeks of treatment.  EXAM: CT LUMBAR SPINE WITHOUT CONTRAST  TECHNIQUE: Multidetector CT imaging of the lumbar spine was performed without intravenous contrast administration. Multiplanar CT image reconstructions were also generated.  RADIATION DOSE REDUCTION: This exam was performed according to the departmental dose-optimization program which includes automated exposure control, adjustment of the mA and/or kV according to patient size and/or use of iterative reconstruction technique.  COMPARISON:  None Available.  FINDINGS: Segmentation: 5 lumbar type vertebrae.  Alignment: Mild L4-5 degenerative anterolisthesis.  Vertebrae: No acute fracture or focal pathologic process. Developmental cleft in the inferior articular process of L2 on the right.  Paraspinal and other soft tissues: Scattered atheromatous calcification of the aorta and iliacs.  Disc levels: Degenerative facet spurring at L5-S1 and especially at L4-5 where there is vacuum phenomenon and moderate hypertrophy with anterolisthesis. No neural impingement seen.  IMPRESSION: Lower lumbar facet osteoarthritis especially at L4-5 where there is anterolisthesis. No bony impingement seen throughout the lumbar spine.   Electronically Signed By: Ronnette Coke M.D. On: 05/12/2023 08:58  Complexity Note: Imaging results reviewed.                         Meds   Current Outpatient Medications:    acetaminophen (TYLENOL) 500 MG tablet, Take 500 mg by mouth every 6 (six) hours as needed. Takes about 4 a day, Disp: , Rfl:    albuterol  (ACCUNEB ) 0.63 MG/3ML nebulizer solution, Take 1 ampule by nebulization every 6 (six) hours as needed for wheezing., Disp: , Rfl:    albuterol  (PROVENTIL ) (2.5 MG/3ML) 0.083% nebulizer solution, Inhale into the lungs., Disp: , Rfl:    albuterol  (VENTOLIN  HFA) 108 (90 Base) MCG/ACT inhaler, Inhale 2-4 puffs by mouth every 4 hours as needed for wheezing,  cough, and/or shortness of breath, Disp: 1 each, Rfl: 1   amLODipine (NORVASC) 10 MG tablet, Take 1 tablet by mouth daily., Disp: , Rfl:    aspirin EC 81 MG tablet, Take 1 tablet (81 mg total) by mouth daily., Disp: , Rfl:    atorvastatin (LIPITOR) 40 MG tablet, Take by mouth., Disp: , Rfl:  celecoxib (CELEBREX) 100 MG capsule, Take 100 mg by mouth 2 (two) times daily., Disp: , Rfl:    celecoxib (CELEBREX) 100 MG capsule, Take by mouth., Disp: , Rfl:    docusate sodium (COLACE) 100 MG capsule, Take 100 mg by mouth 2 (two) times daily., Disp: , Rfl:    doxycycline  (VIBRA -TABS) 100 MG tablet, Take 1 tablet by mouth 2 (two) times daily., Disp: , Rfl:    fluticasone  (FLONASE ) 50 MCG/ACT nasal spray, Place 1 spray into both nostrils daily. (Patient taking differently: Place 1 spray into both nostrils as needed.), Disp: 16 g, Rfl: 1   fluticasone  (FLONASE ) 50 MCG/ACT nasal spray, Use 2 sprays into each nostril daily., Disp: , Rfl:    guaiFENesin  (MUCINEX ) 600 MG 12 hr tablet, Take 1 tablet (600 mg total) by mouth 2 (two) times daily., Disp: 30 tablet, Rfl: 0   hydrochlorothiazide  (MICROZIDE ) 12.5 MG capsule, TAKE 1 CAPSULE BY MOUTH EVERY DAY, Disp: 90 capsule, Rfl: 1   ipratropium-albuterol  (DUONEB) 0.5-2.5 (3) MG/3ML SOLN, Take 3 mLs by nebulization., Disp: , Rfl:    ipratropium-albuterol  (DUONEB) 0.5-2.5 (3) MG/3ML SOLN, Inhale into the lungs., Disp: , Rfl:    Naltrexone -buPROPion  HCl ER 8-90 MG TB12, Contrave  8 mg-90 mg tablet,extended release, Disp: , Rfl:    rosuvastatin  (CRESTOR ) 40 MG tablet, Take 1 tablet (40 mg total) by mouth daily., Disp: 90 tablet, Rfl: 3   Semaglutide , 1 MG/DOSE, (OZEMPIC , 1 MG/DOSE,) 4 MG/3ML SOPN, Inject 1 mg into the skin once a week. Inject into the skin., Disp: 3 mL, Rfl: 0   valsartan (DIOVAN) 320 MG tablet, Take by mouth., Disp: , Rfl:    Vitamin D , Ergocalciferol , (DRISDOL ) 1.25 MG (50000 UNIT) CAPS capsule, Take 1 capsule (50,000 Units total) by mouth every 7  (seven) days., Disp: 12 capsule, Rfl: 1  ROS  Constitutional: Denies any fever or chills Gastrointestinal: No reported hemesis, hematochezia, vomiting, or acute GI distress Musculoskeletal: Denies any acute onset joint swelling, redness, loss of ROM, or weakness Neurological: No reported episodes of acute onset apraxia, aphasia, dysarthria, agnosia, amnesia, paralysis, loss of coordination, or loss of consciousness  Allergies  Ms. Ardis has no known allergies.  PFSH  Drug: Ms. Escorcia  reports no history of drug use. Alcohol:  reports no history of alcohol use. Tobacco:  reports that she has been smoking cigarettes. She started smoking about 25 years ago. She has a 6.4 pack-year smoking history. She has never used smokeless tobacco. Medical:  has a past medical history of Arthritis, Asthma, Benign hypertension (08/19/2016), Fatty liver (08/19/2016), Hypertension, and Tobacco abuse (08/19/2016). Surgical: Ms. Wehrli  has a past surgical history that includes carpel tunnel; Tendon release; Colonoscopy with propofol  (N/A, 11/16/2014); Tubal ligation (2002); Hernia repair (2002); and Colposcopy. Family: family history includes Alcohol abuse in her brother and father; Cirrhosis in her brother and father; Clotting disorder in her mother; Diabetes in her paternal grandmother; Heart attack in her maternal grandmother; Hyperlipidemia in her mother; Multiple sclerosis in her mother; Osteoporosis in her mother; Stroke in her mother.  Constitutional Exam  General appearance: Well nourished, well developed, and well hydrated. In no apparent acute distress There were no vitals filed for this visit. BMI Assessment: Estimated body mass index is 38.44 kg/m as calculated from the following:   Height as of 05/19/23: 5\' 3"  (1.6 m).   Weight as of 05/19/23: 217 lb (98.4 kg).  BMI interpretation table: BMI level Category Range association with higher incidence of chronic pain  <18  kg/m2 Underweight    18.5-24.9 kg/m2 Ideal body weight   25-29.9 kg/m2 Overweight Increased incidence by 20%  30-34.9 kg/m2 Obese (Class I) Increased incidence by 68%  35-39.9 kg/m2 Severe obesity (Class II) Increased incidence by 136%  >40 kg/m2 Extreme obesity (Class III) Increased incidence by 254%   Patient's current BMI Ideal Body weight  There is no height or weight on file to calculate BMI. Patient weight not recorded   BMI Readings from Last 4 Encounters:  05/19/23 38.44 kg/m  04/20/23 38.44 kg/m  04/01/23 38.44 kg/m  03/23/23 38.62 kg/m   Wt Readings from Last 4 Encounters:  05/19/23 217 lb (98.4 kg)  04/20/23 217 lb (98.4 kg)  04/01/23 217 lb (98.4 kg)  03/23/23 218 lb (98.9 kg)    Psych/Mental status: Alert, oriented x 3 (person, place, & time)       Eyes: PERLA Respiratory: No evidence of acute respiratory distress  Assessment & Plan  Primary Diagnosis & Pertinent Problem List: The primary encounter diagnosis was Chronic low back pain (1ry area of Pain) (Right) w/o sciatica. Diagnoses of Chronic hip pain (2ry area of Pain) (Right), Grade 1 Anterolisthesis of lumbar spine (L4/L5), Decreased range of motion of lumbar spine, Facet arthritis, degenerative, lumbar spine, Lumbar facet joint pain, Lumbar facet hypertrophy, and Lumbar facet joint syndrome were also pertinent to this visit. Visit Diagnosis: 1. Chronic low back pain (1ry area of Pain) (Right) w/o sciatica   2. Chronic hip pain (2ry area of Pain) (Right)   3. Grade 1 Anterolisthesis of lumbar spine (L4/L5)   4. Decreased range of motion of lumbar spine   5. Facet arthritis, degenerative, lumbar spine   6. Lumbar facet joint pain   7. Lumbar facet hypertrophy   8. Lumbar facet joint syndrome    Problems updated and reviewed during this visit: Problem  Lumbar Facet Joint Pain  Lumbar facet hypertrophy  Lumbar Facet Joint Syndrome    Plan of Care  Assessment and Plan           Pharmacotherapy (Medications  Ordered): No orders of the defined types were placed in this encounter.  Procedure Orders    No procedure(s) ordered today   Lab Orders  No laboratory test(s) ordered today   Imaging Orders  No imaging studies ordered today   Referral Orders  No referral(s) requested today    Pharmacological management:  Opioid Analgesics: I will not be prescribing any opioids at this time Membrane stabilizer: I will not be prescribing any at this time Muscle relaxant: I will not be prescribing any at this time NSAID: I will not be prescribing any at this time Other analgesic(s): I will not be prescribing any at this time      Interventional Therapies  Risk Factors  Considerations  Medical Comorbidities:  MO (BMI>35)  COPD (BA, CB)  [id number]  HTN     Planned  Pending:   (04/20/2023) order for physical therapy entered today.   Under consideration:   Pending   Completed:   None at this time   Therapeutic  Palliative (PRN) options:   None established   Completed by other providers:   Diagnostic bilateral L3-4, L4-5 IA facet steroid inj. x1 (06/03/2022) (7-2/10) by Annmarie Kindred, MD [facility] PM)        Provider-requested follow-up: No follow-ups on file. Recent Visits Date Type Provider Dept  04/20/23 Office Visit Renaldo Caroli, MD [facility]  Showing recent visits within past 90 days and meeting  all other requirements Future Appointments Date Type Provider Dept  06/03/23 Appointment Renaldo Caroli, MD [facility]  Showing future appointments within next 90 days and meeting all other requirements   Primary Care Physician: Carlean Charter, DO  Duration of encounter: *** minutes.  Total time on encounter, as per AMA guidelines included both the face-to-face and non-face-to-face time personally spent by the physician and/or other qualified health care professional(s) on the day of the encounter (includes time in activities that require the  physician or other qualified health care professional and does not include time in activities normally performed by clinical staff). Physician's time may include the following activities when performed: Preparing to see the patient (e.g., pre-charting review of records, searching for previously ordered imaging, lab work, and nerve conduction tests) Review of prior analgesic pharmacotherapies. Reviewing PMP Interpreting ordered tests (e.g., lab work, imaging, nerve conduction tests) Performing post-procedure evaluations, including interpretation of diagnostic procedures Obtaining and/or reviewing separately obtained history Performing a medically appropriate examination and/or evaluation Counseling and educating the patient/family/caregiver Ordering medications, tests, or procedures Referring and communicating with other health care professionals (when not separately reported) Documenting clinical information in the electronic or other health record Independently interpreting results (not separately reported) and communicating results to the patient/ family/caregiver Care coordination (not separately reported)  Note by: Candi Chafe, MD (TTS technology used. I apologize for any typographical errors that were not detected and corrected.) Date: 06/03/2023; Time: 12:01 PM

## 2023-06-03 ENCOUNTER — Ambulatory Visit
Admission: RE | Admit: 2023-06-03 | Discharge: 2023-06-03 | Disposition: A | Discharge status: Home / Self Care | Payer: Medicaid Other | Attending: Pain Medicine | Admitting: Pain Medicine

## 2023-06-03 ENCOUNTER — Ambulatory Visit
Admission: RE | Admit: 2023-06-03 | Discharge: 2023-06-03 | Disposition: A | Discharge status: Home / Self Care | Payer: Medicaid Other | Source: Ambulatory Visit | Attending: Pain Medicine | Admitting: Pain Medicine

## 2023-06-03 ENCOUNTER — Ambulatory Visit: Payer: Medicaid Other | Admitting: Pain Medicine

## 2023-06-03 DIAGNOSIS — M546 Pain in thoracic spine: Secondary | ICD-10-CM | POA: Insufficient documentation

## 2023-06-03 DIAGNOSIS — M545 Low back pain, unspecified: Secondary | ICD-10-CM | POA: Insufficient documentation

## 2023-06-03 DIAGNOSIS — R937 Abnormal findings on diagnostic imaging of other parts of musculoskeletal system: Secondary | ICD-10-CM | POA: Diagnosis not present

## 2023-06-03 DIAGNOSIS — G8929 Other chronic pain: Secondary | ICD-10-CM | POA: Diagnosis not present

## 2023-06-03 DIAGNOSIS — M25551 Pain in right hip: Secondary | ICD-10-CM | POA: Diagnosis not present

## 2023-06-03 DIAGNOSIS — R1011 Right upper quadrant pain: Secondary | ICD-10-CM | POA: Insufficient documentation

## 2023-06-03 DIAGNOSIS — M5136 Other intervertebral disc degeneration, lumbar region with discogenic back pain only: Secondary | ICD-10-CM | POA: Diagnosis not present

## 2023-06-03 DIAGNOSIS — M5386 Other specified dorsopathies, lumbar region: Secondary | ICD-10-CM

## 2023-06-03 DIAGNOSIS — M5414 Radiculopathy, thoracic region: Secondary | ICD-10-CM

## 2023-06-03 DIAGNOSIS — M4316 Spondylolisthesis, lumbar region: Secondary | ICD-10-CM | POA: Diagnosis not present

## 2023-06-15 ENCOUNTER — Encounter: Payer: Self-pay | Admitting: Physician Assistant

## 2023-06-15 ENCOUNTER — Ambulatory Visit: Payer: Medicaid Other | Admitting: Physician Assistant

## 2023-06-15 VITALS — BP 149/82 | HR 103 | Temp 97.9°F | Ht 63.0 in | Wt 219.0 lb

## 2023-06-15 DIAGNOSIS — Z860100 Personal history of colon polyps, unspecified: Secondary | ICD-10-CM

## 2023-06-15 DIAGNOSIS — R1310 Dysphagia, unspecified: Secondary | ICD-10-CM | POA: Diagnosis not present

## 2023-06-15 MED ORDER — PEG 3350-KCL-NA BICARB-NACL 420 G PO SOLR: 4000.0000 mL | Freq: Once | ORAL | 0 refills | Status: DC

## 2023-06-15 MED ORDER — PEG 3350-KCL-NA BICARB-NACL 420 G PO SOLR: 4000.0000 mL | Freq: Once | ORAL | 0 refills | Status: AC

## 2023-06-15 NOTE — Patient Instructions (Addendum)
Barium swallow scheduled 06-17-23 [facility] arrive at 9:15 am. Nothing to eat/drink 3 hours prior.

## 2023-06-15 NOTE — Progress Notes (Signed)
Brittany Amy, PA-C [address]  [address]  [place], [place] [address]  Main: [phone]  Fax: [phone]   Gastroenterology Consultation  Referring Provider:     Sherlyn Hay, DO Primary Care Physician:  Sherlyn Hay, DO Primary Gastroenterologist:  Brittany Amy, PA-C / Dr. Wyline Mood   Reason for Consultation:     Dysphagia.        HPI:   Brittany Castro is a 63 y.o. y/o female referred for consultation & management  by Sherlyn Hay, DO.    Patient is referred to evaluate dysphagia.  Patient states she has had intermittent episodes of dysphagia to solid foods and large pills for 20 years.  Feels like they get stuck in her throat and go down slowly.  She states she had an EGD 20 years ago by Dr. Lemar Livings and had her throat stretched which helped.  Currently she is having difficulty swallowing her valsartan which is a large pill which gets stuck in her throat.  She chews her food really well into very small pieces in order to swallow food.  She denies abdominal pain, heartburn, odynophagia, nausea, or vomiting.  She has not had a barium swallow test.  10/2014 colonoscopy by Dr. Servando Snare: 4 small (3 mm to 5 mm) polyps removed.  2 polyps were tubular adenomas.  1 hyperplastic, 1 colon mucosa.  Excellent prep.  5-year repeat (overdue).  She has mild intermittent constipation which is controlled on Colace stool softener.  No rectal bleeding.  No family history of colon cancer.  Past Medical History:  Diagnosis Date   Arthritis    Asthma    Benign hypertension 08/19/2016   Fatty liver 08/19/2016   Moderate to severe on CT scan March 2018   Hypertension    Tobacco abuse 08/19/2016    Past Surgical History:  Procedure Laterality Date   carpel tunnel     COLONOSCOPY WITH PROPOFOL N/A 11/16/2014   Procedure: COLONOSCOPY WITH PROPOFOL;  Surgeon: Midge Minium, MD;  Location: [facility] ENDOSCOPY;  Service: Endoscopy;  Laterality: N/A;   COLPOSCOPY     HERNIA REPAIR  2002   Dr  Lemar Livings   TENDON RELEASE     TUBAL LIGATION  2002    Prior to Admission medications   Medication Sig Start Date End Date Taking? Authorizing Provider  acetaminophen (TYLENOL) 500 MG tablet Take 500 mg by mouth every 6 (six) hours as needed. Takes about 4 a day    [provider]  albuterol (ACCUNEB) 0.63 MG/3ML nebulizer solution Take 1 ampule by nebulization every 6 (six) hours as needed for wheezing.    [provider]  albuterol (PROVENTIL) (2.5 MG/3ML) 0.083% nebulizer solution Inhale into the lungs. 05/27/22 05/27/23  [provider]  albuterol (VENTOLIN HFA) 108 (90 Base) MCG/ACT inhaler Inhale 2-4 puffs by mouth every 4 hours as needed for wheezing, cough, and/or shortness of breath 03/23/23   Pardue, Monico Blitz, DO  amLODipine (NORVASC) 10 MG tablet Take 1 tablet by mouth daily. 02/25/22 09/15/23  [provider]  aspirin EC 81 MG tablet Take 1 tablet (81 mg total) by mouth daily. 10/28/17   Kerman Passey, MD  atorvastatin (LIPITOR) 40 MG tablet Take by mouth. 02/25/22   [provider]  celecoxib (CELEBREX) 100 MG capsule Take 100 mg by mouth 2 (two) times daily.    [provider]  celecoxib (CELEBREX) 100 MG capsule Take by mouth. 11/04/22 08/01/23  [provider]  docusate sodium (COLACE) 100 MG capsule Take 100 mg by mouth 2 (two) times daily.    [provider]  doxycycline (VIBRA-TABS) 100 MG tablet Take 1 tablet by mouth 2 (two) times daily.    [provider]  fluticasone (FLONASE) 50 MCG/ACT nasal spray Place 1 spray into both nostrils daily. Patient taking differently: Place 1 spray into both nostrils as needed. 10/13/17   Loleta Rose, MD  fluticasone (FLONASE) 50 MCG/ACT nasal spray Use 2 sprays into each nostril daily. 02/25/22   [provider]  guaiFENesin (MUCINEX) 600 MG 12 hr tablet Take 1 tablet (600 mg total) by mouth 2 (two) times daily. 05/19/23   Sherlyn Hay, DO   hydrochlorothiazide (MICROZIDE) 12.5 MG capsule TAKE 1 CAPSULE BY MOUTH EVERY DAY 04/14/23   Pardue, Monico Blitz, DO  ipratropium-albuterol (DUONEB) 0.5-2.5 (3) MG/3ML SOLN Take 3 mLs by nebulization.    [provider]  ipratropium-albuterol (DUONEB) 0.5-2.5 (3) MG/3ML SOLN Inhale into the lungs. 05/27/22 05/27/23  [provider]  Naltrexone-buPROPion HCl ER 8-90 MG TB12 Contrave 8 mg-90 mg tablet,extended release    [provider]  rosuvastatin (CRESTOR) 40 MG tablet Take 1 tablet (40 mg total) by mouth daily. 03/31/23   Pardue, Monico Blitz, DO  Semaglutide, 1 MG/DOSE, (OZEMPIC, 1 MG/DOSE,) 4 MG/3ML SOPN Inject 1 mg into the skin once a week. Inject into the skin. 05/08/23 04/02/24  Debera Lat, PA-C  valsartan (DIOVAN) 320 MG tablet Take by mouth. 10/29/21 09/15/23  [provider]  Vitamin D, Ergocalciferol, (DRISDOL) 1.25 MG (50000 UNIT) CAPS capsule Take 1 capsule (50,000 Units total) by mouth every 7 (seven) days. 05/19/23   Sherlyn Hay, DO    Family History  Problem Relation Age of Onset   Multiple sclerosis Mother    Clotting disorder Mother    Hyperlipidemia Mother    Stroke Mother    Osteoporosis Mother    Alcohol abuse Father    Cirrhosis Father    Alcohol abuse Brother    Cirrhosis Brother    Heart attack Maternal Grandmother    Diabetes Paternal Grandmother      Social History   Tobacco Use   Smoking status: Every Day    Current packs/day: 0.25    Average packs/day: 0.3 packs/day for 25.6 years (6.4 ttl pk-yrs)    Types: Cigarettes    Start date: 10/28/1997   Smokeless tobacco: Never  Vaping Use   Vaping status: Never Used  Substance Use Topics   Alcohol use: No   Drug use: No    Allergies as of 06/15/2023   (No Known Allergies)    Review of Systems:    All systems reviewed and negative except where noted in HPI.   Physical Exam:  BP (!) 149/82   Pulse (!) 103   Temp 97.9 F (36.6 C)   Ht 5\' 3"  (1.6 m)   Wt 219 lb  (99.3 kg)   BMI 38.79 kg/m  No LMP recorded. Patient is postmenopausal.  General:   Alert,  Well-developed, obese, pleasant and cooperative in NAD; Walks with a Walker. Lungs:  Respirations even and unlabored.  Clear throughout to auscultation.   No wheezes, crackles, or rhonchi. No acute distress. Heart:  Regular rate and rhythm; no murmurs, clicks, rubs, or gallops. Abdomen:  Normal bowel sounds.  No bruits.  Soft, and obese without masses, hepatosplenomegaly or hernias noted.  No Tenderness.  No guarding or rebound tenderness.    Neurologic:  Alert and  oriented x3;  grossly normal neurologically. Psych:  Alert and cooperative. Normal mood and affect.  Imaging Studies: DG Thoracic Spine W/Swimmers Result Date: 06/09/2023 CLINICAL DATA:  UPPER thoracic back pain. EXAM: THORACIC SPINE - 3 VIEWS COMPARISON:  Chest x-ray 11/02/2017 FINDINGS: There is normal alignment of the thoracic spine. No evidence for acute fracture or subluxation. No lytic blastic lesions. IMPRESSION: Negative. Electronically Signed   By: Norva Pavlov M.D.   On: 06/09/2023 06:37   DG Lumbar Spine Complete W/Bend Result Date: 06/09/2023 CLINICAL DATA:  LOWER back pain. EXAM: LUMBAR SPINE - COMPLETE WITH BENDING VIEWS COMPARISON:  CT 04/29/2023 FINDINGS: At L4-5, there is disc height loss. In neutral position, there is 2 millimeters anterolisthesis of L4 on L5. This increases to 7 millimeters in flexion and 6 millimeters in extension. There is moderate facet hypertrophy in the mid and LOWER lumbar levels. IMPRESSION: 1. Degenerative disc disease at L4-5. 2. Grade 1 anterolisthesis of L4 on L5 with evidence for instability during flexion and extension. These results will be called to the ordering clinician or representative by the Radiologist Assistant, and communication documented in the PACS or [employer]. Electronically Signed   By: Norva Pavlov M.D.   On: 06/09/2023 06:35    Assessment and Plan:   Brittany Castro is a 63 y.o. y/o female has been referred for:   1.  Dysphagia  Barium swallow with Tablet  Scheduling EGD with possible dilation I discussed risks of EGD with patient to include risk of bleeding, perforation, and risk of sedation.  Patient expressed understanding and agrees to proceed with EGD.   2.  History of colon polyps  Scheduling Colonoscopy I discussed risks of colonoscopy with patient to include risk of bleeding, colon perforation, and risk of sedation.  Patient expressed understanding and agrees to proceed with colonoscopy.   Follow up Will be based on Results of above Tests.  Brittany Amy, PA-C

## 2023-06-17 ENCOUNTER — Ambulatory Visit: Admission: RE | Admit: 2023-06-17 | Payer: Medicaid Other | Source: Ambulatory Visit

## 2023-06-23 DIAGNOSIS — M47817 Spondylosis without myelopathy or radiculopathy, lumbosacral region: Secondary | ICD-10-CM | POA: Insufficient documentation

## 2023-06-23 NOTE — Progress Notes (Signed)
 PROVIDER NOTE: Information contained herein reflects review and annotations entered in association with encounter. Interpretation of such information and data should be left to medically-trained personnel. Information provided to patient can be located elsewhere in the medical record under Patient Instructions. Document created using STT-dictation technology, any transcriptional errors that may result from process are unintentional.    Patient: Brittany Castro  Service Category: E/M  Provider: Eric DELENA Como, MD  DOB: 03-04-1961  DOS: 06/24/2023  Referring Provider: Donzella Lauraine SAILOR DO  MRN: [id number]  Specialty: Interventional Pain Management  PCP: Donzella Lauraine SAILOR, DO  Type: Established Patient  Setting: Ambulatory outpatient    Location: Office  Delivery: Face-to-face     Primary Reason(s) for Visit: Encounter for evaluation before starting new chronic pain management plan of care (Level of risk: moderate) CC: Back Pain (R>L) and Hip Pain  HPI  Brittany Castro is a 63 y.o. year old, female patient, who comes today for a follow-up evaluation to review the test results and decide on a treatment plan. 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); AC (acromioclavicular) arthritis; Postmenopausal bleeding; High risk medication use; Esophageal dysphagia; 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); Lumbar facet arthropathy; History of abnormal cervical Pap smear; Depression, major, single episode, severe  (HCC); Muscle strain of lower extremity; Arthralgia of multiple sites; Chronic low back pain (1ry area of Pain) (Right) w/o sciatica; COVID-19; Dysfunction of left eustachian tube; Tibialis posterior tendinitis; Lumbar spondylosis; Healthcare maintenance; Chronic pain syndrome; Pharmacologic therapy; Disorder of skeletal system; Problems influencing health status; Abnormal MRI, lumbar spine (12/05/2021) [facility]); Grade 1 Anterolisthesis of lumbar spine (L4/L5) (Unstable); Chronic thoracic back pain (3ry area of Pain) (Right); Chronic hip pain (2ry area of Pain) (Right); Physical deconditioning; Decreased range of motion of lumbar spine; Numbness and tingling of hand (intermittent) (Left); Thoracic radicular pain (Right); Annual physical exam; Vitamin D  deficiency; Nocturia; Sleep disturbance; Subacute cough; Encounter for smoking cessation counseling; Lumbar facet joint pain; Lumbar facet hypertrophy; Lumbar facet joint syndrome; Spondylosis without myelopathy or radiculopathy, lumbosacral region; Abnormal CT scan, lumbar spine (05/12/2023); Abnormal x-ray of lumbar spine (with bending views) (06/09/2023); and 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 Her primarily concern today is the Back Pain (R>L) and Hip Pain  Pain Assessment: Location: Lower, Right, Left Back Radiating: radaites to buttocks bilateral and hips bialteral Onset: More than a month ago Duration: Chronic pain Quality: Aching, Throbbing, Constant Severity: 7 /10 (subjective, self-reported pain score)  Effect on ADL: Limtis ADLs. Walking long distances is difficult unable to go shopping Timing: Constant Modifying factors: Sitting with padded chair on home BP:    HR:    Brittany Castro comes in today for a follow-up visit after her initial evaluation on 04/20/2023. Today we went over the results of her tests. These were explained in Layman's terms. During today's appointment we went over my diagnostic impression, as well as the  proposed treatment plan.  Review of initial evaluation (04/20/2023): The patient, with a history of chronic low back pain (Bilateral) (R>L) for approximately three to four years, presents for further evaluation. The pain is localized to the  right side of the lower back, with occasional discomfort on the left. The patient has undergone multiple diagnostic procedures, including MRIs and x-rays, and has received injections for pain management. The most recent MRI, conducted in July 2023, revealed a Grade 1 anterolisthesis of L4 over L5, moderate facet arthropathy at L4-5 and L5-S1 levels, and mild multilevel degenerative changes. The patient denies any history of back surgeries or physical therapy.   The patient also reports a sensation of 'restless legs,' predominantly in the right leg, which is described as an inability to keep the leg still, particularly in the evenings. There is no associated pain, numbness, or weakness in the lower extremities.   In addition to the back pain, the patient experiences discomfort in the right hip and a tingling sensation in the left hand, described as the hand 'going to sleep.' This numbness affects the entire hand, predominantly in the thumb area, and is most noticeable at night, particularly when sleeping on the left side. The patient has a history of carpal tunnel surgery on the right hand and tendonitis in the left hand.   The patient also reports intermittent right upper quadrant abdominal pain, which can occur at any time and is described as extremely painful. The pain appears to radiate to the right flank and the right side of the upper back. Despite previous investigations, including a CT scan, the cause of this abdominal pain remains undetermined. The patient denies any recent falls but mentions a history of 'slipping' without actual falls.   The patient has not been working since 2019 and reports limited physical activity, primarily due to the persistent pain.  The patient's daily routine involves minimal movement, with most of the day spent sitting with a pillow for back support.  Review of diagnostic studies ordered on 04/20/2023: Lab work: C-reactive protein, magnesium, sed rate, and vitamin B12 levels were all within normal limits.  The comprehensive metabolic panel showed an elevated alkaline phosphatase level of 154 international units/L (normal 44-1 21) with a mildly elevated glucose level, however the patient was not n.p.o. at the time of the lab work.  Vitamin D  levels were shown to be significantly decreased at 8.8 ng/mL (normal 30-100) demonstrating a vitamin D  deficiency. Diagnostic imaging: Diagnostic x-rays of the thoracic spine were within normal limits.  Diagnostic x-rays of the lumbar spine with flexion and extension demonstrated an unstable spine at L4-5.  (06/09/2023) LUMBAR SPINE WITH BENDING VIEWS FINDINGS: At L4-5, there is disc height loss. In neutral position, there is 2 mm anterolisthesis of L4 on L5. This increases to 7 mm in flexion and 6 mm in extension. (>4 mm displacement) There is moderate facet hypertrophy in the mid and LOWER lumbar levels. IMPRESSION: 1. Degenerative disc disease at L4-5. 2. Grade 1 anterolisthesis of L4 on L5 with evidence for instability during flexion and extension.  Physical therapy was also ordered on 04/20/2023.  Discussed the use of AI scribe software for clinical note transcription with the patient, who gave verbal consent to proceed.  History of Present Illness   JULYA ALIOTO is a 63 year old female with chronic low back pain who presents for evaluation of spinal instability and vitamin D  deficiency.  She experiences chronic low back pain primarily on the right side, with occasional discomfort on the left. She has undergone multiple diagnostic procedures, including bilateral lumbar facet injections targeting the L3-4 and L4-5 facets. An MRI from July 2020 revealed grade one anterolisthesis of  L4 over L5 and facet  joint arthritis affecting L4-5 and L5-S1. Recent x-rays indicate spinal instability, with L4 slipping forward 2 mm in a neutral position, increasing to 7 mm in flexion and 6 mm in extension.  She has hip pain, though no x-rays of the hips have been performed yet. There is a concern that the hip pain may be referred from her back if no hip pathology is found.  She has a significant vitamin D  deficiency, with a level of 8.8 ng/mL, noted in recent lab work. She was prescribed vitamin D  supplements in December and has completed the initial prescription.  She experiences thoracic spine pain radiating to the abdominal area. Previous x-rays of the thoracic spine were normal, but they only assessed bone structures. There is a possibility of a disc herniation or spinal stenosis in the thoracic region causing nerve compression and referred pain.  Her mother has a history of polymyositis, a muscle disease causing weakness, and uses a wheelchair. This family history is noted as it may relate to musculoskeletal concerns.      Patient presented with interventional treatment options. Ms. Difabio was informed that I will not be providing medication management. Pharmacotherapy evaluation including recommendations may be offered, if specifically requested.   Controlled Substance Pharmacotherapy Assessment REMS (Risk Evaluation and Mitigation Strategy)  Opioid Analgesic: No chronic opioid analgesics therapy prescribed by our practice. None MME/day: 0 mg/day  Pill Count: None expected due to no prior prescriptions written by our practice. Bonner Norris, RN  06/24/2023  9:56 AM  Sign when Signing Visit Safety precautions to be maintained throughout the outpatient stay will include: orient to surroundings, keep bed in low position, maintain call bell within reach at all times, provide assistance with transfer out of bed and ambulation.    Pharmacokinetics: Liberation and absorption (onset of  action): WNL Distribution (time to peak effect): WNL Metabolism and excretion (duration of action): WNL         Pharmacodynamics: Desired effects: Analgesia: Ms. Winborne reports >50% benefit. Functional ability: Patient reports that medication allows her to accomplish basic ADLs Clinically meaningful improvement in function (CMIF): Sustained CMIF goals met Perceived effectiveness: Described as relatively effective, allowing for increase in activities of daily living (ADL) Undesirable effects: Side-effects or Adverse reactions: None reported Monitoring: [place] PMP: PDMP reviewed during this encounter. Online review of the past 34-month period previously conducted. Not applicable at this point since we have not taken over the patient's medication management yet. List of other Serum/Urine Drug Screening Test(s):  No results found for: AMPHSCRSER, BARBSCRSER, BENZOSCRSER, COCAINSCRSER, COCAINSCRNUR, PCPSCRSER, THCSCRSER, THCU, CANNABQUANT, OPIATESCRSER, OXYSCRSER, PROPOXSCRSER, ETH, CBDTHCR, D8THCCBX, D9THCCBX List of all UDS test(s) done:  Lab Results  Component Value Date   SUMMARY FINAL 04/20/2023   Last UDS on record: Summary  Date Value Ref Range Status  04/20/2023 FINAL  Final    Comment:    ==================================================================== Compliance Drug Analysis, Ur ==================================================================== Test                             Result       Flag       Units  Drug Present and Declared for Prescription Verification   Acetaminophen                  PRESENT      EXPECTED  Drug Present not Declared for Prescription Verification   Doxylamine  PRESENT      UNEXPECTED   Dextromethorphan               PRESENT      UNEXPECTED   Dextrorphan/Levorphanol        PRESENT      UNEXPECTED    Dextrorphan is an expected metabolite of dextromethorphan, an over-    the-counter or prescription  cough suppressant. Dextrorphan cannot be    distinguished from the scheduled prescription medication levorphanol    by the method used for analysis.  Drug Absent but Declared for Prescription Verification   Naltrexone                      Not Detected UNEXPECTED   Bupropion                       Not Detected UNEXPECTED   Salicylate                     Not Detected UNEXPECTED    Aspirin, as indicated in the declared medication list, is not always    detected even when used as directed.    Naproxen                       Not Detected UNEXPECTED ==================================================================== Test                      Result    Flag   Units      Ref Range   Creatinine              87               mg/dL      >=79 ==================================================================== Declared Medications:  The flagging and interpretation on this report are based on the  following declared medications.  Unexpected results may arise from  inaccuracies in the declared medications.   **Note: The testing scope of this panel includes these medications:   Bupropion   Naltrexone   Naproxen (Naprosyn)   **Note: The testing scope of this panel does not include small to  moderate amounts of these reported medications:   Acetaminophen (Tylenol)  Aspirin   **Note: The testing scope of this panel does not include the  following reported medications:   Albuterol  (AccuNeb )  Albuterol  (Duoneb)  Amlodipine (Norvasc)  Atorvastatin (Lipitor)  Celecoxib (Celebrex)  Docusate (Colace)  Doxycycline   Fluticasone  (Flonase )  Hydrochlorothiazide  (Microzide )  Ipratropium (Duoneb)  Rosuvastatin  (Crestor )  Semaglutide  (Ozempic )  Valsartan (Diovan) ==================================================================== For clinical consultation, please call [phone]. ====================================================================    UDS interpretation: No unexpected findings.           Medication Assessment Form: Not applicable. No opioids. Treatment compliance: Not applicable Risk Assessment Profile: Aberrant behavior: See initial evaluations. None observed or detected today Comorbid factors increasing risk of overdose: See initial evaluation. No additional risks detected today Opioid risk tool (ORT):     04/20/2023    9:38 AM  Opioid Risk   Alcohol 3  Illegal Drugs 0  Rx Drugs 0  Alcohol 0  Illegal Drugs 0  Rx Drugs 0  Age between 16-45 years  0  Psychological Disease 0  Depression 0  Opioid Risk Tool Scoring 3  Opioid Risk Interpretation Low Risk    ORT Scoring interpretation table:  Score <3 = Low Risk for SUD  Score between 4-7 = Moderate Risk for SUD  Score >8 = High  Risk for Opioid Abuse   Risk of substance use disorder (SUD): Low  Risk Mitigation Strategies:  Patient opioid safety counseling: No controlled substances prescribed. Patient-Prescriber Agreement (PPA): No agreement signed.  Controlled substance notification to other providers: None required. No opioid therapy.  Pharmacologic Plan: Non-opioid analgesic therapy offered. Interventional alternatives discussed.             Laboratory Chemistry Profile   Renal Lab Results  Component Value Date   BUN 10 04/20/2023   CREATININE 0.71 04/20/2023   BCR 14 04/20/2023   GFRAA >60 07/16/2018   GFRNONAA >60 07/16/2018   SPECGRAV 1.014 10/28/2017   PHUR 5.0 10/28/2017   PROTEINUR 100 (A) 07/16/2018     Electrolytes Lab Results  Component Value Date   NA 142 04/20/2023   K 4.1 04/20/2023   CL 105 04/20/2023   CALCIUM  9.3 04/20/2023   MG 2.2 04/20/2023     Hepatic Lab Results  Component Value Date   AST 21 04/20/2023   ALT 22 03/23/2023   ALBUMIN 4.2 04/20/2023   ALKPHOS 154 (H) 04/20/2023   LIPASE 33 07/16/2018     ID No results found for: LYMEIGGIGMAB, HIV, SARSCOV2NAA, STAPHAUREUS, MRSAPCR, HCVAB, PREGTESTUR, RMSFIGG, QFVRPH1IGG, QFVRPH2IGG    Bone Lab Results  Component Value Date   25OHVITD1 8.8 (L) 04/20/2023   25OHVITD2 <1.0 04/20/2023   25OHVITD3 8.5 04/20/2023     Endocrine Lab Results  Component Value Date   GLUCOSE 100 (H) 04/20/2023   GLUCOSEU NEGATIVE 07/16/2018   HGBA1C 7.7 (H) 03/23/2023     Neuropathy Lab Results  Component Value Date   VITAMINB12 456 04/20/2023   HGBA1C 7.7 (H) 03/23/2023     CNS No results found for: COLORCSF, APPEARCSF, RBCCOUNTCSF, WBCCSF, POLYSCSF, LYMPHSCSF, EOSCSF, PROTEINCSF, GLUCCSF, JCVIRUS, CSFOLI, IGGCSF, LABACHR, ACETBL   Inflammation (CRP: Acute  ESR: Chronic) Lab Results  Component Value Date   CRP 9 04/20/2023   ESRSEDRATE 35 04/20/2023     Rheumatology No results found for: RF, ANA, LABURIC, URICUR, LYMEIGGIGMAB, LYMEABIGMQN, HLAB27   Coagulation Lab Results  Component Value Date   APTT 26.3 12/28/2013   PLT 223 07/16/2018     Cardiovascular Lab Results  Component Value Date   CKTOTAL 95 12/28/2013   CKMB 0.6 12/28/2013   TROPONINI <0.03 08/14/2016   HGB 14.8 07/16/2018   HCT 43.0 07/16/2018     Screening No results found for: SARSCOV2NAA, COVIDSOURCE, STAPHAUREUS, MRSAPCR, HCVAB, HIV, PREGTESTUR   Cancer No results found for: CEA, CA125, LABCA2   Allergens No results found for: ALMOND, APPLE, ASPARAGUS, AVOCADO, BANANA, BARLEY, BASIL, BAYLEAF, GREENBEAN, LIMABEAN, WHITEBEAN, BEEFIGE, REDBEET, BLUEBERRY, BROCCOLI, CABBAGE, MELON, CARROT, CASEIN, CASHEWNUT, CAULIFLOWER, CELERY     Note: Lab results reviewed.  Recent Diagnostic Imaging Review  Shoulder Imaging: Shoulder-R DG: Results for orders placed during the hospital encounter of 02/12/18 DG Shoulder Right  Narrative CLINICAL DATA:  Five day history of right neck and right shoulder pain with no known injury. Decreased range of motion. History of arthritis.  EXAM: RIGHT SHOULDER -  2+ VIEW  COMPARISON:  Limited views of the right shoulder from a chest x-ray of November 02, 2017  FINDINGS: The bones are subjectively adequately mineralized. The glenohumeral joint space is well maintained. There is mild degenerative change of the [facility] joint. The subacromial subdeltoid space is well-maintained. There is no acute fracture or dislocation.  IMPRESSION: There is no acute bony abnormality of the right shoulder. There is mild degenerative change of the [facility] joint.  Electronically Signed By: David  Jordan M.D. On: 02/15/2018 08:07  Lumbosacral Imaging: Lumbar CT wo contrast: Results for orders placed during the hospital encounter of 04/29/23 CT LUMBAR SPINE WO CONTRAST  Narrative CLINICAL DATA:  Low back pain with symptoms persisting over 6 weeks of treatment.  EXAM: CT LUMBAR SPINE WITHOUT CONTRAST  TECHNIQUE: Multidetector CT imaging of the lumbar spine was performed without intravenous contrast administration. Multiplanar CT image reconstructions were also generated.  RADIATION DOSE REDUCTION: This exam was performed according to the departmental dose-optimization program which includes automated exposure control, adjustment of the mA and/or kV according to patient size and/or use of iterative reconstruction technique.  COMPARISON:  None Available.  FINDINGS: Segmentation: 5 lumbar type vertebrae.  Alignment: Mild L4-5 degenerative anterolisthesis.  Vertebrae: No acute fracture or focal pathologic process. Developmental cleft in the inferior articular process of L2 on the right.  Paraspinal and other soft tissues: Scattered atheromatous calcification of the aorta and iliacs.  Disc levels: Degenerative facet spurring at L5-S1 and especially at L4-5 where there is vacuum phenomenon and moderate hypertrophy with anterolisthesis. No neural impingement seen.  IMPRESSION: Lower lumbar facet osteoarthritis especially at L4-5 where there  is anterolisthesis. No bony impingement seen throughout the lumbar spine.   Electronically Signed By: Dorn Roulette M.D. On: 05/12/2023 08:58  Complexity Note: Imaging results reviewed.                         Meds   Current Outpatient Medications:    acetaminophen (TYLENOL) 500 MG tablet, Take 500 mg by mouth every 6 (six) hours as needed. Takes about 4 a day, Disp: , Rfl:    albuterol  (ACCUNEB ) 0.63 MG/3ML nebulizer solution, Take 1 ampule by nebulization every 6 (six) hours as needed for wheezing., Disp: , Rfl:    albuterol  (VENTOLIN  HFA) 108 (90 Base) MCG/ACT inhaler, Inhale 2-4 puffs by mouth every 4 hours as needed for wheezing, cough, and/or shortness of breath, Disp: 1 each, Rfl: 1   amLODipine (NORVASC) 10 MG tablet, Take 1 tablet by mouth daily., Disp: , Rfl:    aspirin EC 81 MG tablet, Take 1 tablet (81 mg total) by mouth daily., Disp: , Rfl:    atorvastatin (LIPITOR) 40 MG tablet, Take by mouth., Disp: , Rfl:    celecoxib (CELEBREX) 100 MG capsule, Take 100 mg by mouth 2 (two) times daily., Disp: , Rfl:    celecoxib (CELEBREX) 100 MG capsule, Take by mouth., Disp: , Rfl:    Cholecalciferol (VITAMIN D3) 125 MCG (5000 UT) CAPS, Take 1 capsule (5,000 Units total) by mouth daily with breakfast. Take along with calcium  and magnesium., Disp: 90 capsule, Rfl: 0   docusate sodium (COLACE) 100 MG capsule, Take 100 mg by mouth 2 (two) times daily., Disp: , Rfl:    doxycycline  (VIBRA -TABS) 100 MG tablet, Take 1 tablet by mouth 2 (two) times daily., Disp: , Rfl:    [START ON 06/25/2023] ergocalciferol  (VITAMIN D2) 1.25 MG (50000 UT) capsule, Take 1 capsule (50,000 Units total) by mouth 2 (two) times a week. X 6 weeks., Disp: 12 capsule, Rfl: 0   fluticasone  (FLONASE ) 50 MCG/ACT nasal spray, Place 1 spray into both nostrils daily. (Patient taking differently: Place 1 spray into both nostrils as needed.), Disp: 16 g, Rfl: 1   fluticasone  (FLONASE ) 50 MCG/ACT nasal spray, Use 2 sprays into  each nostril daily., Disp: , Rfl:    guaiFENesin  (MUCINEX ) 600 MG 12 hr tablet, Take 1  tablet (600 mg total) by mouth 2 (two) times daily., Disp: 30 tablet, Rfl: 0   hydrochlorothiazide  (MICROZIDE ) 12.5 MG capsule, TAKE 1 CAPSULE BY MOUTH EVERY DAY, Disp: 90 capsule, Rfl: 1   ipratropium-albuterol  (DUONEB) 0.5-2.5 (3) MG/3ML SOLN, Take 3 mLs by nebulization., Disp: , Rfl:    Naltrexone -buPROPion  HCl ER 8-90 MG TB12, Contrave  8 mg-90 mg tablet,extended release, Disp: , Rfl:    polyethylene glycol-electrolytes (NULYTELY ) 420 g solution, Take by mouth., Disp: , Rfl:    rosuvastatin  (CRESTOR ) 40 MG tablet, Take 1 tablet (40 mg total) by mouth daily., Disp: 90 tablet, Rfl: 3   Semaglutide , 1 MG/DOSE, (OZEMPIC , 1 MG/DOSE,) 4 MG/3ML SOPN, Inject 1 mg into the skin once a week. Inject into the skin., Disp: 3 mL, Rfl: 0   valsartan (DIOVAN) 320 MG tablet, Take by mouth., Disp: , Rfl:    Vitamin D , Ergocalciferol , (DRISDOL ) 1.25 MG (50000 UNIT) CAPS capsule, Take 1 capsule (50,000 Units total) by mouth every 7 (seven) days., Disp: 12 capsule, Rfl: 1  ROS  Constitutional: Denies any fever or chills Gastrointestinal: No reported hemesis, hematochezia, vomiting, or acute GI distress Musculoskeletal: Denies any acute onset joint swelling, redness, loss of ROM, or weakness Neurological: No reported episodes of acute onset apraxia, aphasia, dysarthria, agnosia, amnesia, paralysis, loss of coordination, or loss of consciousness  Allergies  Ms. Duke has no known allergies.  PFSH  Drug: Ms. Noga  reports no history of drug use. Alcohol:  reports no history of alcohol use. Tobacco:  reports that she has been smoking cigarettes. She started smoking about 25 years ago. She has a 6.4 pack-year smoking history. She has never used smokeless tobacco. Medical:  has a past medical history of Arthritis, Asthma, Benign hypertension (08/19/2016), Fatty liver (08/19/2016), Hypertension, and Tobacco abuse  (08/19/2016). Surgical: Ms. Littrell  has a past surgical history that includes carpel tunnel; Tendon release; Colonoscopy with propofol  (N/A, 11/16/2014); Tubal ligation (2002); Hernia repair (2002); and Colposcopy. Family: family history includes Alcohol abuse in her brother and father; Cirrhosis in her brother and father; Clotting disorder in her mother; Diabetes in her paternal grandmother; Heart attack in her maternal grandmother; Hyperlipidemia in her mother; Multiple sclerosis in her mother; Osteoporosis in her mother; Stroke in her mother.  Constitutional Exam  General appearance: Well nourished, well developed, and well hydrated. In no apparent acute distress Vitals:   06/24/23 0956  Resp: 16  Temp: (!) 97.2 F (36.2 C)  TempSrc: Temporal  Weight: 218 lb (98.9 kg)  Height: 5' 3 (1.6 m)   BMI Assessment: Estimated body mass index is 38.62 kg/m as calculated from the following:   Height as of this encounter: 5' 3 (1.6 m).   Weight as of this encounter: 218 lb (98.9 kg).  BMI interpretation table: BMI level Category Range association with higher incidence of chronic pain  <18 kg/m2 Underweight   18.5-24.9 kg/m2 Ideal body weight   25-29.9 kg/m2 Overweight Increased incidence by 20%  30-34.9 kg/m2 Obese (Class I) Increased incidence by 68%  35-39.9 kg/m2 Severe obesity (Class II) Increased incidence by 136%  >40 kg/m2 Extreme obesity (Class III) Increased incidence by 254%   Patient's current BMI Ideal Body weight  Body mass index is 38.62 kg/m. Ideal body weight: 52.4 kg (115 lb 8.3 oz) Adjusted ideal body weight: 71 kg (156 lb 8.2 oz)   BMI Readings from Last 4 Encounters:  06/24/23 38.62 kg/m  06/15/23 38.79 kg/m  05/19/23 38.44 kg/m  04/20/23 38.44 kg/m  Wt Readings from Last 4 Encounters:  06/24/23 218 lb (98.9 kg)  06/15/23 219 lb (99.3 kg)  05/19/23 217 lb (98.4 kg)  04/20/23 217 lb (98.4 kg)    Psych/Mental status: Alert, oriented x 3 (person, place, &  time)       Eyes: PERLA Respiratory: No evidence of acute respiratory distress  Assessment & Plan  Primary Diagnosis & Pertinent Problem List: The primary encounter diagnosis was Chronic low back pain (1ry area of Pain) (Right) w/o sciatica. 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 Visit Diagnosis: 1. Chronic low back pain (1ry area of Pain) (Right) w/o sciatica   2. Chronic hip pain (2ry area of Pain) (Right)   3. Chronic thoracic back pain (3ry area of Pain) (Right)   4. Decreased range of motion of lumbar spine   5. Grade 1 Anterolisthesis of lumbar spine (L4/L5) (Unstable)   6. 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 Lumbar facet arthropathy   9. Lumbar facet hypertrophy   10. Lumbar facet joint pain   11. Lumbar facet joint syndrome   12. Abnormal MRI, lumbar spine (12/05/2021)   13. Abnormal CT scan, lumbar spine (05/12/2023)   14. Abnormal x-ray of lumbar spine (with bending views) (06/09/2023)   15. Thoracic radicular pain (Right)   16. Vitamin D  deficiency   17. Other intervertebral disc degeneration, thoracic region     Problems updated and reviewed during this visit: Problem  Abnormal CT scan, lumbar spine (05/12/2023)   (05/12/2023) LUMBAR CT  FINDINGS: Alignment: Mild L4-5 degenerative anterolisthesis. 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 Paraspinal and other soft tissues: Scattered atheromatous calcification of the aorta and iliacs.  DISC LEVELS:  Degenerative facet spurring at L5-S1 and especially at L4-5 where there is vacuum phenomenon and moderate hypertrophy with anterolisthesis. No neural impingement seen.  IMPRESSION: Lower lumbar facet osteoarthritis especially at L4-5 where there is anterolisthesis.    Abnormal x-ray of lumbar spine (with bending views) (06/09/2023)   (06/09/2023) LUMBAR SPINE WITH BENDING VIEWS FINDINGS: At L4-5, there is disc height loss. In neutral position, there is 2 mm anterolisthesis of L4 on L5. This increases to 7 mm in flexion and 6 mm in extension. (>4 mm displacement)  There is moderate facet hypertrophy in the mid and LOWER lumbar levels.   IMPRESSION: 1. Degenerative disc disease at L4-5. 2. Grade 1 anterolisthesis of L4 on L5 with evidence for instability during flexion and extension.   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 In neutral position, there is 2 mm anterolisthesis of L4 on L5. This increases to 7 mm in flexion and 6 mm in extension. (>4 mm displacement) There is moderate facet hypertrophy in the mid and LOWER lumbar levels. IMPRESSION: 1. Degenerative disc disease at L4-5. 2. Grade 1  anterolisthesis of L4 on L5 with evidence for instability during flexion and extension.   Abnormal MRI, lumbar spine (12/05/2021) [facility])   (12/05/2021) LUMBAR MRI FINDINGS: [facility]) Trace grade 1 anterolisthesis of L4 on L5, slightly less pronounced than prior radiograph. Slight loss of intervertebral disc signal at L3-4 and L4-5. Disc spaces are preserved. No convincing evidence for pars interarticularis fractures. Moderate facet arthropathy at the L4-L5 and L5-S1  level.  Moderate atrophy of the paraspinal musculature. For the purposes of this dictation, the lowest well formed intervertebral disc space is assumed to be the L5-S1 level, and there are presumed to be five lumbar-type vertebral bodies.  IMPRESSION: Within the limitations of moderate motion artifact, there are mild multilevel degenerative changes with trace anterolisthesis of L4 and L5, likely less pronounced than on the prior lumbar spine radiograph. There is no significant spinal canal or neural foraminal narrowing.   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 An MRI from July 2020 revealed grade 1 anterolisthesis of L4 over L5 and facet joint arthritis at L4-5 and L5-S1. X-rays on flexion and extension show instability with a 5 mm difference in slippage, likely contributing to pain and potential nerve impingement. Previous bilateral lumbar facet injections at L3-4 and L4-5 missed the L5-S1 level. It was discussed that 98% of patients with facet joint issues involve L3-4, L4-5, and L5-S1. Insurance restrictions may limit comprehensive diagnostic injections. Instability may require surgical fusion to prevent further slippage and nerve impingement, potentially alleviating pain by stabilizing the spine. Refer to Neurosurgery for evaluation of spinal instability and potential fusion surgery. Repeat lumbar facet block including L3-4, L4-5, and L5-S1 levels. Order MRI of the thoracic spine to evaluate for potential disc herniation or spinal stenosis causing thoracic and abdominal  pain.  Hip Pain Hip pain is reported without prior imaging. The differential diagnosis includes hip arthritis versus referred pain from lumbar spine pathology. If hip x-rays show no arthritis, the pain is likely referred from the lumbar spine. Order x-rays of hips to evaluate for arthritis.  Vitamin D  Deficiency Severe vitamin D  deficiency is noted at 8.8 ng/mL (normal >30 ng/mL). This deficiency can exacerbate pain perception and contribute to bone health issues. A previous prescription for vitamin D  was completed. It is advised to take vitamin D  in the morning to avoid sleep disturbances. Order vitamin D  level to assess response to initial treatment. Prescribe vitamin D  50,000 IU twice weekly for 6 weeks and over-the-counter vitamin D  for maintenance. Advise taking vitamin D  in the morning to avoid sleep disturbances.  General Health Maintenance The importance of vitamin D  supplementation for bone health and pain management, especially post-menopause, was discussed. Provide information on spondylolisthesis and its implications. Advise maintaining an organized schedule for medical appointments.  Follow-up Ensure an after-visit summary with detailed instructions. Coordinate with the pharmacy for the vitamin D  prescription. Schedule follow-up after the neurosurgery consultation and diagnostic tests.      Pharmacotherapy (Medications Ordered): Meds ordered this encounter  Medications   ergocalciferol  (VITAMIN D2) 1.25 MG (50000 UT) capsule    Sig: Take 1 capsule (50,000 Units total)  by mouth 2 (two) times a week. X 6 weeks.    Dispense:  12 capsule    Refill:  0    Fill one day early if pharmacy is closed on scheduled refill date. May substitute for generic, or similar, if available.   Cholecalciferol (VITAMIN D3) 125 MCG (5000 UT) CAPS    Sig: Take 1 capsule (5,000 Units total) by mouth daily with breakfast. Take along with calcium  and magnesium.    Dispense:  90 capsule    Refill:  0     Fill 1 day early if pharmacy is closed on scheduled refill date. Generic permitted. Do not send renewal requests.   Procedure Orders         LUMBAR FACET(MEDIAL BRANCH NERVE BLOCK) MBNB     Lab Orders         25-Hydroxy vitamin D  Lcms D2+D3     Imaging Orders         DG HIPS BILAT W OR W/O PELVIS MIN 5 VIEWS         MR THORACIC SPINE WO CONTRAST     Referral Orders         Ambulatory referral to Neurosurgery      Pharmacological management:  Opioid Analgesics: I will not be prescribing any opioids at this time Membrane stabilizer: I will not be prescribing any at this time Muscle relaxant: I will not be prescribing any at this time NSAID: I will not be prescribing any at this time Other analgesic(s): I will not be prescribing any at this time      Interventional Therapies  Risk Factors  Considerations  Medical Comorbidities:  MO (BMI>35)  COPD (BA, CB)  [id number]  HTN     Planned  Pending:   Diagnostic bilateral (L3-4, L4-5, L5-S1) lumbar facet MBB #1    Under consideration:   Diagnostic bilateral (L3-4, L4-5, L5-S1) lumbar facet MBB #1  Neurosurgery consult secondary to L4-5 instability. Diagnostic CT vs MRI of thoracic spine to evaluate thoracic back pain and abdominal pain (thoracic radiculitis/radiculopathy).   Completed:   (04/20/2023) order for physical therapy entered.   Therapeutic  Palliative (PRN) options:   None established   Completed by other providers:   Diagnostic bilateral L3-4, L4-5 IA facet steroid inj. x1 (06/03/2022) (7-2/10) by Rosalia Susy LABOR, MD (Dominika Sophie Agent, MD (Attending)) [facility] PM)      Provider-requested follow-up: Return for (ECT): (B) (L3-4, L4-5, L5-S1) L-FCT BLK #1. Recent Visits Date Type Provider Dept  04/20/23 Office Visit Tanya Glisson, MD [facility]  Showing recent visits within past 90 days and meeting all other requirements Today's Visits Date Type Provider Dept  06/24/23 Office Visit Tanya Glisson, MD [facility]  Showing today's visits and meeting all other requirements Future Appointments No visits were found meeting these conditions. Showing future appointments within next 90 days and meeting all other requirements   Primary Care Physician: Donzella Lauraine SAILOR, DO  Duration of encounter: 69 minutes.  Total time on encounter, as per AMA guidelines included both the face-to-face and non-face-to-face time personally spent by the physician and/or other qualified health care professional(s) on the day of the encounter (includes time in activities that require the physician or other qualified health care professional and does not include time in activities normally performed by clinical staff). Physician's time may include the following activities when performed: Preparing to see the patient (e.g., pre-charting review of records, searching for previously ordered imaging, lab work, and nerve  conduction tests) Review of prior analgesic pharmacotherapies. Reviewing PMP Interpreting ordered tests (e.g., lab work, imaging, nerve conduction tests) Performing post-procedure evaluations, including interpretation of diagnostic procedures Obtaining and/or reviewing separately obtained history Performing a medically appropriate examination and/or evaluation Counseling and educating the patient/family/caregiver Ordering medications, tests, or procedures Referring and communicating with other health care professionals (when not separately reported) Documenting clinical information in the electronic or other health record Independently interpreting results (not separately reported) and communicating results to the patient/ family/caregiver Care coordination (not separately reported)  Note by: Eric DELENA Como, MD (TTS technology used. I apologize for any typographical errors that were not detected and corrected.) Date: 06/24/2023; Time: 10:54 AM

## 2023-06-23 NOTE — Patient Instructions (Signed)
 ____________________________________________________________________________________________  Spondylolisthesis  Spondylolisthesis is a condition that occurs when a vertebra in the spine slips out of place, usually in the lower back. Symptoms can vary from mild to severe, and a person may have no symptoms.  Some common symptoms include:  Back pain, especially chronic pain  Pain that radiates down the legs  Pain that worsens with exercise  Tightness in the hamstrings  Neck stiffness  Loss of spine flexibility  Weakness in the legs or trouble walking  Numbness and tingling in the groin and/or buttocks   Some causes of spondylolisthesis include: Birth defects. Sudden injury. Abnormal wear on the cartilage and bones, such as arthritis. Bone disease and fractures. Certain sports activities, such as gymnastics, weightlifting, and football.  A doctor can diagnose spondylolisthesis with a physical exam, X-rays, and possibly a CT scan.    Forward slippage is known as "Anterolisthesis".  Backward slippage is known as "Retrolisthesis".   Pathophysiology of Spondylolisthesis:   Grading Classification of Spondylolisthesis Grade I spondylolisthesis is 1 to 25% slippage, grade II is up to 50% slippage, grade III is up to 75% slippage, and grade IV is 76-100% slippage. If there is more than 100% slippage, it is known as spondyloptosis or grade V spondylolisthesis.       ____________________________________________________________________________________________    ______________________________________________________________________    Procedure instructions  Stop blood-thinners  Do not eat or drink fluids (other than water) for 6 hours before your procedure  No water for 2 hours before your procedure  Take your blood pressure medicine with a sip of water  Arrive 30 minutes before your appointment  If sedation is planned, bring suitable driver. Pennie Banter, Benedetto Goad, & public  transportation are NOT APPROVED)  Carefully read the "Preparing for your procedure" detailed instructions  If you have questions call us at [phone]  Procedure appointments are for procedures only. NO medication refills or new problem evaluations.   ______________________________________________________________________      ______________________________________________________________________    Preparing for your procedure  Appointments: If you think you may not be able to keep your appointment, call 24-48 hours in advance to cancel. We need time to make it available to others.  Procedure visits are for procedures only. During your procedure appointment there will be: NO Prescription Refills*. NO medication changes or discussions*. NO discussion of disability issues*. NO unrelated pain problem evaluations*. NO evaluations to order other pain procedures*. *These will be addressed at a separate and distinct evaluation encounter on the provider's evaluation schedule and not during procedure days.  Instructions: Food intake: Avoid eating anything solid for at least 8 hours prior to your procedure. Clear liquid intake: You may take clear liquids such as water up to 2 hours prior to your procedure. (No carbonated drinks. No soda.) Transportation: Unless otherwise stated by your physician, bring a driver. (Driver cannot be a Market researcher, Pharmacist, community, or any other form of public transportation.) Morning Medicines: Except for blood thinners, take all of your other morning medications with a sip of water. Make sure to take your heart and blood pressure medicines. If your blood pressure's lower number is above 100, the case will be rescheduled. Blood thinners: Make sure to stop your blood thinners as instructed.  If you take a blood thinner, but were not instructed to stop it, call our office [phone] and ask to talk to a nurse. Not stopping a blood thinner prior to certain procedures could lead  to serious complications. Diabetics on insulin: Notify the staff so that you  can be scheduled 1st case in the morning. If your diabetes requires high dose insulin, take only  of your normal insulin dose the morning of the procedure and notify the staff that you have done so. Preventing infections: Shower with an antibacterial soap the morning of your procedure.  Build-up your immune system: Take 1000 mg of Vitamin C with every meal (3 times a day) the day prior to your procedure. Antibiotics: Inform the nursing staff if you are taking any antibiotics or if you have any conditions that may require antibiotics prior to procedures. (Example: recent joint implants)   Pregnancy: If you are pregnant make sure to notify the nursing staff. Not doing so may result in injury to the fetus, including death.  Sickness: If you have a cold, fever, or any active infections, call and cancel or reschedule your procedure. Receiving steroids while having an infection may result in complications. Arrival: You must be in the facility at least 30 minutes prior to your scheduled procedure. Tardiness: Your scheduled time is also the cutoff time. If you do not arrive at least 15 minutes prior to your procedure, you will be rescheduled.  Children: Do not bring any children with you. Make arrangements to keep them home. Dress appropriately: There is always a possibility that your clothing may get soiled. Avoid long dresses. Valuables: Do not bring any jewelry or valuables.  Reasons to call and reschedule or cancel your procedure: (Following these recommendations will minimize the risk of a serious complication.) Surgeries: Avoid having procedures within 2 weeks of any surgery. (Avoid for 2 weeks before or after any surgery). Flu Shots: Avoid having procedures within 2 weeks of a flu shots or . (Avoid for 2 weeks before or after immunizations). Barium: Avoid having a procedure within 7-10 days after having had a radiological  study involving the use of radiological contrast. (Myelograms, Barium swallow or enema study). Heart attacks: Avoid any elective procedures or surgeries for the initial 6 months after a "Myocardial Infarction" (Heart Attack). Blood thinners: It is imperative that you stop these medications before procedures. Let us know if you if you take any blood thinner.  Infection: Avoid procedures during or within two weeks of an infection (including chest colds or gastrointestinal problems). Symptoms associated with infections include: Localized redness, fever, chills, night sweats or profuse sweating, burning sensation when voiding, cough, congestion, stuffiness, runny nose, sore throat, diarrhea, nausea, vomiting, cold or Flu symptoms, recent or current infections. It is specially important if the infection is over the area that we intend to treat. Heart and lung problems: Symptoms that may suggest an active cardiopulmonary problem include: cough, chest pain, breathing difficulties or shortness of breath, dizziness, ankle swelling, uncontrolled high or unusually low blood pressure, and/or palpitations. If you are experiencing any of these symptoms, cancel your procedure and contact your primary care physician for an evaluation.  Remember:  Regular Business hours are:  Monday to Thursday 8:00 AM to 4:00 PM  Provider's Schedule: Delano Metz, MD:  Procedure days: Tuesday and Thursday 7:30 AM to 4:00 PM  Edward Jolly, MD:  Procedure days: Monday and Wednesday 7:30 AM to 4:00 PM Last  Updated: 04/28/2023 ______________________________________________________________________      ______________________________________________________________________    General Risks and Possible Complications  Patient Responsibilities: It is important that you read this as it is part of your informed consent. It is our duty to inform you of the risks and possible complications associated with treatments offered to  you. It is your  responsibility as a patient to read this and to ask questions about anything that is not clear or that you believe was not covered in this document.  Patient's Rights: You have the right to refuse treatment. You also have the right to change your mind, even after initially having agreed to have the treatment done. However, under this last option, if you wait until the last second to change your mind, you may be charged for the materials used up to that point.  Introduction: Medicine is not an Visual merchandiser. Everything in Medicine, including the lack of treatment(s), carries the potential for danger, harm, or loss (which is by definition: Risk). In Medicine, a complication is a secondary problem, condition, or disease that can aggravate an already existing one. All treatments carry the risk of possible complications. The fact that a side effects or complications occurs, does not imply that the treatment was conducted incorrectly. It must be clearly understood that these can happen even when everything is done following the highest safety standards.  No treatment: You can choose not to proceed with the proposed treatment alternative. The "PRO(s)" would include: avoiding the risk of complications associated with the therapy. The "CON(s)" would include: not getting any of the treatment benefits. These benefits fall under one of three categories: diagnostic; therapeutic; and/or palliative. Diagnostic benefits include: getting information which can ultimately lead to improvement of the disease or symptom(s). Therapeutic benefits are those associated with the successful treatment of the disease. Finally, palliative benefits are those related to the decrease of the primary symptoms, without necessarily curing the condition (example: decreasing the pain from a flare-up of a chronic condition, such as incurable terminal cancer).  General Risks and Complications: These are associated to most  interventional treatments. They can occur alone, or in combination. They fall under one of the following six (6) categories: no benefit or worsening of symptoms; bleeding; infection; nerve damage; allergic reactions; and/or death. No benefits or worsening of symptoms: In Medicine there are no guarantees, only probabilities. No healthcare provider can ever guarantee that a medical treatment will work, they can only state the probability that it may. Furthermore, there is always the possibility that the condition may worsen, either directly, or indirectly, as a consequence of the treatment. Bleeding: This is more common if the patient is taking a blood thinner, either prescription or over the counter (example: Goody Powders, Fish oil, Aspirin, Garlic, etc.), or if suffering a condition associated with impaired coagulation (example: Hemophilia, cirrhosis of the liver, low platelet counts, etc.). However, even if you do not have one on these, it can still happen. If you have any of these conditions, or take one of these drugs, make sure to notify your treating physician. Infection: This is more common in patients with a compromised immune system, either due to disease (example: diabetes, cancer, human immunodeficiency virus [HIV], etc.), or due to medications or treatments (example: therapies used to treat cancer and rheumatological diseases). However, even if you do not have one on these, it can still happen. If you have any of these conditions, or take one of these drugs, make sure to notify your treating physician. Nerve Damage: This is more common when the treatment is an invasive one, but it can also happen with the use of medications, such as those used in the treatment of cancer. The damage can occur to small secondary nerves, or to large primary ones, such as those in the spinal cord and brain. This damage may be temporary  or permanent and it may lead to impairments that can range from temporary numbness to  permanent paralysis and/or brain death. Allergic Reactions: Any time a substance or material comes in contact with our body, there is the possibility of an allergic reaction. These can range from a mild skin rash (contact dermatitis) to a severe systemic reaction (anaphylactic reaction), which can result in death. Death: In general, any medical intervention can result in death, most of the time due to an unforeseen complication. ______________________________________________________________________

## 2023-06-24 ENCOUNTER — Ambulatory Visit: Payer: Medicaid Other | Attending: Pain Medicine | Admitting: Pain Medicine

## 2023-06-24 ENCOUNTER — Encounter: Payer: Self-pay | Admitting: Pain Medicine

## 2023-06-24 VITALS — Temp 97.2°F | Resp 16 | Ht 63.0 in | Wt 218.0 lb

## 2023-06-24 DIAGNOSIS — M546 Pain in thoracic spine: Secondary | ICD-10-CM | POA: Diagnosis present

## 2023-06-24 DIAGNOSIS — E559 Vitamin D deficiency, unspecified: Secondary | ICD-10-CM

## 2023-06-24 DIAGNOSIS — M5134 Other intervertebral disc degeneration, thoracic region: Secondary | ICD-10-CM | POA: Diagnosis present

## 2023-06-24 DIAGNOSIS — M4316 Spondylolisthesis, lumbar region: Secondary | ICD-10-CM | POA: Diagnosis present

## 2023-06-24 DIAGNOSIS — M532X6 Spinal instabilities, lumbar region: Secondary | ICD-10-CM | POA: Insufficient documentation

## 2023-06-24 DIAGNOSIS — M47816 Spondylosis without myelopathy or radiculopathy, lumbar region: Secondary | ICD-10-CM | POA: Diagnosis present

## 2023-06-24 DIAGNOSIS — M5386 Other specified dorsopathies, lumbar region: Secondary | ICD-10-CM

## 2023-06-24 DIAGNOSIS — M5459 Other low back pain: Secondary | ICD-10-CM

## 2023-06-24 DIAGNOSIS — M25551 Pain in right hip: Secondary | ICD-10-CM | POA: Diagnosis present

## 2023-06-24 DIAGNOSIS — M5414 Radiculopathy, thoracic region: Secondary | ICD-10-CM | POA: Diagnosis present

## 2023-06-24 DIAGNOSIS — R937 Abnormal findings on diagnostic imaging of other parts of musculoskeletal system: Secondary | ICD-10-CM | POA: Insufficient documentation

## 2023-06-24 DIAGNOSIS — M47817 Spondylosis without myelopathy or radiculopathy, lumbosacral region: Secondary | ICD-10-CM

## 2023-06-24 DIAGNOSIS — M545 Low back pain, unspecified: Secondary | ICD-10-CM

## 2023-06-24 DIAGNOSIS — G8929 Other chronic pain: Secondary | ICD-10-CM

## 2023-06-24 MED ORDER — ERGOCALCIFEROL 1.25 MG (50000 UT) PO CAPS: 50000.0000 [IU] | ORAL_CAPSULE | ORAL | 0 refills | Status: AC

## 2023-06-24 MED ORDER — VITAMIN D3 125 MCG (5000 UT) PO CAPS: 5000.0000 [IU] | ORAL_CAPSULE | Freq: Every day | ORAL | 0 refills | Status: AC

## 2023-06-24 NOTE — Progress Notes (Signed)
 Safety precautions to be maintained throughout the outpatient stay will include: orient to surroundings, keep bed in low position, maintain call bell within reach at all times, provide assistance with transfer out of bed and ambulation.

## 2023-06-29 LAB — 25-HYDROXY VITAMIN D LCMS D2+D3
25-Hydroxy, Vitamin D-2: <1 ng/mL
25-Hydroxy, Vitamin D-3: 5.8 ng/mL
25-Hydroxy, Vitamin D: 6.3 ng/mL — ABNORMAL LOW

## 2023-06-30 ENCOUNTER — Ambulatory Visit: Payer: Medicaid Other | Admitting: Family Medicine

## 2023-07-02 ENCOUNTER — Ambulatory Visit
Admission: RE | Admit: 2023-07-02 | Discharge: 2023-07-02 | Disposition: A | Discharge status: Home / Self Care | Payer: Medicaid Other | Source: Ambulatory Visit | Attending: Pain Medicine | Admitting: Pain Medicine

## 2023-07-02 DIAGNOSIS — M5414 Radiculopathy, thoracic region: Secondary | ICD-10-CM | POA: Diagnosis present

## 2023-07-02 DIAGNOSIS — G8929 Other chronic pain: Secondary | ICD-10-CM | POA: Insufficient documentation

## 2023-07-02 DIAGNOSIS — M546 Pain in thoracic spine: Secondary | ICD-10-CM | POA: Insufficient documentation

## 2023-07-02 DIAGNOSIS — M5134 Other intervertebral disc degeneration, thoracic region: Secondary | ICD-10-CM | POA: Insufficient documentation

## 2023-07-09 ENCOUNTER — Encounter: Payer: Self-pay | Admitting: Gastroenterology

## 2023-07-16 ENCOUNTER — Ambulatory Visit: Payer: Medicare Other | Admitting: Anesthesiology

## 2023-07-16 ENCOUNTER — Encounter: Payer: Self-pay | Admitting: Gastroenterology

## 2023-07-16 ENCOUNTER — Ambulatory Visit
Admission: RE | Admit: 2023-07-16 | Discharge: 2023-07-16 | Disposition: A | Discharge status: Home / Self Care | Payer: Medicare Other | Attending: Gastroenterology | Admitting: Gastroenterology

## 2023-07-16 ENCOUNTER — Encounter
Admission: RE | Disposition: A | Discharge status: Home / Self Care | Payer: Self-pay | Source: Home / Self Care | Attending: Gastroenterology

## 2023-07-16 DIAGNOSIS — F17210 Nicotine dependence, cigarettes, uncomplicated: Secondary | ICD-10-CM | POA: Insufficient documentation

## 2023-07-16 DIAGNOSIS — I10 Essential (primary) hypertension: Secondary | ICD-10-CM | POA: Diagnosis not present

## 2023-07-16 DIAGNOSIS — K2950 Unspecified chronic gastritis without bleeding: Secondary | ICD-10-CM | POA: Insufficient documentation

## 2023-07-16 DIAGNOSIS — Z860101 Personal history of adenomatous and serrated colon polyps: Secondary | ICD-10-CM | POA: Diagnosis not present

## 2023-07-16 DIAGNOSIS — K319 Disease of stomach and duodenum, unspecified: Secondary | ICD-10-CM

## 2023-07-16 DIAGNOSIS — J4489 Other specified chronic obstructive pulmonary disease: Secondary | ICD-10-CM | POA: Insufficient documentation

## 2023-07-16 DIAGNOSIS — R1310 Dysphagia, unspecified: Secondary | ICD-10-CM

## 2023-07-16 DIAGNOSIS — K317 Polyp of stomach and duodenum: Secondary | ICD-10-CM | POA: Diagnosis not present

## 2023-07-16 DIAGNOSIS — F32A Depression, unspecified: Secondary | ICD-10-CM | POA: Insufficient documentation

## 2023-07-16 DIAGNOSIS — Z860100 Personal history of colon polyps, unspecified: Secondary | ICD-10-CM

## 2023-07-16 DIAGNOSIS — E119 Type 2 diabetes mellitus without complications: Secondary | ICD-10-CM | POA: Insufficient documentation

## 2023-07-16 DIAGNOSIS — Z1211 Encounter for screening for malignant neoplasm of colon: Secondary | ICD-10-CM | POA: Diagnosis present

## 2023-07-16 DIAGNOSIS — Z7985 Long-term (current) use of injectable non-insulin antidiabetic drugs: Secondary | ICD-10-CM | POA: Diagnosis not present

## 2023-07-16 HISTORY — PX: BIOPSY: SHX5522

## 2023-07-16 HISTORY — PX: COLONOSCOPY WITH PROPOFOL: SHX5780

## 2023-07-16 HISTORY — PX: ESOPHAGOGASTRODUODENOSCOPY (EGD) WITH PROPOFOL: SHX5813

## 2023-07-16 SURGERY — ESOPHAGOGASTRODUODENOSCOPY (EGD) WITH PROPOFOL
Anesthesia: General

## 2023-07-16 MED ORDER — PROPOFOL 10 MG/ML IV BOLUS
INTRAVENOUS | Status: DC | PRN
  Administered 2023-07-16: 100 mg via INTRAVENOUS

## 2023-07-16 MED ORDER — LIDOCAINE 2% (20 MG/ML) 5 ML SYRINGE
INTRAMUSCULAR | Status: DC | PRN
  Administered 2023-07-16: 100 mg via INTRAVENOUS

## 2023-07-16 MED ORDER — PROPOFOL 500 MG/50ML IV EMUL
INTRAVENOUS | Status: DC | PRN
  Administered 2023-07-16: 200 ug/kg/min via INTRAVENOUS

## 2023-07-16 MED ORDER — PROPOFOL 1000 MG/100ML IV EMUL
INTRAVENOUS | Status: AC
Start: 2023-07-16 — End: ?
  Filled 2023-07-16: qty 100

## 2023-07-16 MED ORDER — LIDOCAINE HCL (PF) 2 % IJ SOLN
INTRAMUSCULAR | Status: AC
  Filled 2023-07-16: qty 5

## 2023-07-16 MED ORDER — GLYCOPYRROLATE 0.2 MG/ML IJ SOLN
INTRAMUSCULAR | Status: DC | PRN
  Administered 2023-07-16: .2 mg via INTRAVENOUS

## 2023-07-16 MED ORDER — SODIUM CHLORIDE 0.9 % IV SOLN: INTRAVENOUS | Status: DC

## 2023-07-16 NOTE — Transfer of Care (Signed)
 Immediate Anesthesia Transfer of Care Note  Patient: Brittany Castro  Procedure(s) Performed: ESOPHAGOGASTRODUODENOSCOPY (EGD) WITH PROPOFOL COLONOSCOPY WITH PROPOFOL BIOPSY  Patient Location: Endoscopy Unit  Anesthesia Type:General  Level of Consciousness: awake  Airway & Oxygen Therapy: Patient Spontanous Breathing  Post-op Assessment: Report given to RN and Post -op Vital signs reviewed and stable  Post vital signs: Reviewed and stable  Last Vitals:  Vitals Value Taken Time  BP 126/93 07/16/23 0911  Temp    Pulse 120 07/16/23 0912  Resp 15 07/16/23 0912  SpO2 100 % 07/16/23 0912  Vitals shown include unfiled device data.  Last Pain:  Vitals:   07/16/23 0801  TempSrc: Temporal  PainSc: 0-No pain         Complications: No notable events documented.

## 2023-07-16 NOTE — Op Note (Signed)
 [facility] Gastroenterology Patient Name: Brittany Castro Procedure Date: 07/16/2023 8:37 AM MRN: [id number] Account #: [id number] Date of Birth: [birth year] Admit Type: Outpatient Age: 63 Room: [facility] ENDO ROOM 2 Gender: Female Note Status: Finalized Instrument Name: Upper Endoscope [id number] Procedure:             Upper GI endoscopy Indications:           Dysphagia Providers:             Wyline Mood MD, MD Referring MD:          Sherlyn Hay (Referring MD) Medicines:             Monitored Anesthesia Care Complications:         No immediate complications. Procedure:             Pre-Anesthesia Assessment:                        - Prior to the procedure, a History and Physical was                         performed, and patient medications, allergies and                         sensitivities were reviewed. The patient's tolerance                         of previous anesthesia was reviewed.                        - The risks and benefits of the procedure and the                         sedation options and risks were discussed with the                         patient. All questions were answered and informed                         consent was obtained.                        - ASA Grade Assessment: II - A patient with mild                         systemic disease.                        After obtaining informed consent, the endoscope was                         passed under direct vision. Throughout the procedure,                         the patient's blood pressure, pulse, and oxygen                         saturations were monitored continuously. The Endoscope                         was introduced through  the mouth, and advanced to the                         third part of duodenum. The upper GI endoscopy was                         accomplished with ease. The patient tolerated the                         procedure well. Findings:      The examined esophagus was  normal. Biopsies were taken with a cold       forceps for histology.      One 20 mm sessile polyp with no bleeding was found in the duodenal bulb.       Biopsies were taken with a cold forceps for histology.      Diffuse moderate inflammation characterized by congestion (edema),       erythema and nodularity was found in the entire examined stomach.       Biopsies were taken with a cold forceps for histology.      The cardia and gastric fundus were normal on retroflexion. Impression:            - Normal esophagus. Biopsied.                        - One duodenal polyp. Biopsied.                        - Gastritis. Biopsied. Recommendation:        - Await pathology results.                        - Perform a colonoscopy today. Procedure Code(s):     --- Professional ---                        [phone], Esophagogastroduodenoscopy, flexible,                         transoral; with biopsy, single or multiple Diagnosis Code(s):     --- Professional ---                        K31.7, Polyp of stomach and duodenum                        K29.70, Gastritis, unspecified, without bleeding                        R13.10, Dysphagia, unspecified CPT copyright 2022 American Medical Association. All rights reserved. The codes documented in this report are preliminary and upon coder review may  be revised to meet current compliance requirements. Wyline Mood, MD Wyline Mood MD, MD 07/16/2023 8:54:33 AM This report has been signed electronically. Number of Addenda: 0 Note Initiated On: 07/16/2023 8:37 AM Estimated Blood Loss:  Estimated blood loss: none.      [facility]

## 2023-07-16 NOTE — Anesthesia Postprocedure Evaluation (Signed)
 Anesthesia Post Note  Patient: Brittany Castro  Procedure(s) Performed: ESOPHAGOGASTRODUODENOSCOPY (EGD) WITH PROPOFOL COLONOSCOPY WITH PROPOFOL BIOPSY  Patient location during evaluation: PACU Anesthesia Type: General Level of consciousness: awake and awake and alert Pain management: satisfactory to patient Vital Signs Assessment: post-procedure vital signs reviewed and stable Respiratory status: spontaneous breathing Cardiovascular status: blood pressure returned to baseline Anesthetic complications: no   No notable events documented.   Last Vitals:  Vitals:   07/16/23 0930 07/16/23 0935  BP: 122/81   Pulse: (!) 111 (!) 109  Resp: (!) 33 (!) 36  Temp:    SpO2: 94% 100%    Last Pain:  Vitals:   07/16/23 0910  TempSrc: Temporal  PainSc:                  [place]

## 2023-07-16 NOTE — Op Note (Signed)
 [facility] Gastroenterology Patient Name: Brittany Castro Procedure Date: 07/16/2023 8:37 AM MRN: [id number] Account #: [id number] Date of Birth: 02-Aug-1960 Admit Type: Outpatient Age: 63 Room: [facility] ENDO ROOM 2 Gender: Female Note Status: Finalized Instrument Name: Prentice Docker [id number] Procedure:             Colonoscopy Indications:           Surveillance: Personal history of adenomatous polyps                         on last colonoscopy > 5 years ago, Last colonoscopy:                         June 2016 Providers:             Wyline Mood MD, MD Referring MD:          Sherlyn Hay (Referring MD) Medicines:             Monitored Anesthesia Care Complications:         No immediate complications. Procedure:             Pre-Anesthesia Assessment:                        - Prior to the procedure, a History and Physical was                         performed, and patient medications, allergies and                         sensitivities were reviewed. The patient's tolerance                         of previous anesthesia was reviewed.                        - The risks and benefits of the procedure and the                         sedation options and risks were discussed with the                         patient. All questions were answered and informed                         consent was obtained.                        - ASA Grade Assessment: II - A patient with mild                         systemic disease.                        After obtaining informed consent, the colonoscope was                         passed under direct vision. Throughout the procedure,                         the patient's blood  pressure, pulse, and oxygen                         saturations were monitored continuously. The                         Colonoscope was introduced through the anus and                         advanced to the the cecum, identified by the                         appendiceal  orifice. The colonoscopy was performed                         with ease. The patient tolerated the procedure well.                         The quality of the bowel preparation was adequate. The                         ileocecal valve, appendiceal orifice, and rectum were                         photographed. Findings:      The perianal and digital rectal examinations were normal.      The entire examined colon appeared normal on direct and retroflexion       views. Impression:            - The entire examined colon is normal on direct and                         retroflexion views.                        - No specimens collected. Recommendation:        - Discharge patient to home (with escort).                        - Resume previous diet.                        - Continue present medications.                        - Repeat colonoscopy in 5 years for surveillance. Procedure Code(s):     --- Professional ---                        [phone], Colonoscopy, flexible; diagnostic, including                         collection of specimen(s) by brushing or washing, when                         performed (separate procedure) Diagnosis Code(s):     --- Professional ---                        Z86.010, Personal history of colonic polyps CPT copyright 2022 American Medical Association. All  rights reserved. The codes documented in this report are preliminary and upon coder review may  be revised to meet current compliance requirements. Wyline Mood, MD Wyline Mood MD, MD 07/16/2023 9:06:47 AM This report has been signed electronically. Number of Addenda: 0 Note Initiated On: 07/16/2023 8:37 AM Scope Withdrawal Time: 0 hours 6 minutes 44 seconds  Total Procedure Duration: 0 hours 8 minutes 11 seconds  Estimated Blood Loss:  Estimated blood loss: none.      [facility]

## 2023-07-16 NOTE — Anesthesia Preprocedure Evaluation (Signed)
 Anesthesia Evaluation  Patient identified by MRN, date of birth, ID band Patient awake    Reviewed: Allergy & Precautions, NPO status , Patient's Chart, lab work & pertinent test results  Airway Mallampati: III  TM Distance: >3 FB Neck ROM: full    Dental  (+) Teeth Intact   Pulmonary neg pulmonary ROS, asthma , COPD, Current Smoker and Patient abstained from smoking.   Pulmonary exam normal  + decreased breath sounds      Cardiovascular Exercise Tolerance: Good hypertension, Pt. on medications negative cardio ROS Normal cardiovascular exam Rhythm:Regular Rate:Normal     Neuro/Psych    Depression    negative neurological ROS  negative psych ROS   GI/Hepatic negative GI ROS, Neg liver ROS,,,  Endo/Other  negative endocrine ROSdiabetes, Type 2  Class 3 obesity  Renal/GU negative Renal ROS  negative genitourinary   Musculoskeletal   Abdominal  (+) + obese  Peds negative pediatric ROS (+)  Hematology negative hematology ROS (+)   Anesthesia Other Findings Past Medical History: No date: Arthritis No date: Asthma 08/19/2016: Benign hypertension 08/19/2016: Fatty liver     Comment:  Moderate to severe on CT scan March 2018 No date: Hypertension 08/19/2016: Tobacco abuse  Past Surgical History: No date: carpel tunnel 11/16/2014: COLONOSCOPY WITH PROPOFOL; N/A     Comment:  Procedure: COLONOSCOPY WITH PROPOFOL;  Surgeon: Midge Minium, MD;  Location: [facility] ENDOSCOPY;  Service: Endoscopy;              Laterality: N/A; No date: COLPOSCOPY 2002: HERNIA REPAIR     Comment:  Dr Lemar Livings No date: TENDON RELEASE 2002: TUBAL LIGATION  BMI    Body Mass Index: 38.44 kg/m      Reproductive/Obstetrics negative OB ROS                             Anesthesia Physical Anesthesia Plan  ASA: 3  Anesthesia Plan: General   Post-op Pain Management:    Induction: Intravenous  PONV  Risk Score and Plan: Propofol infusion and TIVA  Airway Management Planned: Natural Airway and Nasal Cannula  Additional Equipment:   Intra-op Plan:   Post-operative Plan:   Informed Consent: I have reviewed the patients History and Physical, chart, labs and discussed the procedure including the risks, benefits and alternatives for the proposed anesthesia with the patient or authorized representative who has indicated his/her understanding and acceptance.     Dental Advisory Given  Plan Discussed with: CRNA  Anesthesia Plan Comments:        Anesthesia Quick Evaluation

## 2023-07-16 NOTE — H&P (Signed)
 Wyline Mood, MD [address], [address], [place], [place], [address] [address], [place], [place], [address] Phone: [phone]  Fax: [phone]  Primary Care Physician:  Sherlyn Hay, DO   Pre-Procedure History & Physical: HPI:  Brittany Castro is a 63 y.o. female is here for an endoscopy and colonoscopy    Past Medical History:  Diagnosis Date   Arthritis    Asthma    Benign hypertension 08/19/2016   Fatty liver 08/19/2016   Moderate to severe on CT scan March 2018   Hypertension    Tobacco abuse 08/19/2016    Past Surgical History:  Procedure Laterality Date   carpel tunnel     COLONOSCOPY WITH PROPOFOL N/A 11/16/2014   Procedure: COLONOSCOPY WITH PROPOFOL;  Surgeon: Midge Minium, MD;  Location: [facility] ENDOSCOPY;  Service: Endoscopy;  Laterality: N/A;   COLPOSCOPY     HERNIA REPAIR  2002   Dr Lemar Livings   TENDON RELEASE     TUBAL LIGATION  2002    Prior to Admission medications   Medication Sig Start Date End Date Taking? Authorizing Provider  acetaminophen (TYLENOL) 500 MG tablet Take 500 mg by mouth every 6 (six) hours as needed. Takes about 4 a day    [provider]  albuterol (ACCUNEB) 0.63 MG/3ML nebulizer solution Take 1 ampule by nebulization every 6 (six) hours as needed for wheezing.    [provider]  albuterol (VENTOLIN HFA) 108 (90 Base) MCG/ACT inhaler Inhale 2-4 puffs by mouth every 4 hours as needed for wheezing, cough, and/or shortness of breath 03/23/23   Pardue, Monico Blitz, DO  amLODipine (NORVASC) 10 MG tablet Take 1 tablet by mouth daily. 02/25/22 09/15/23  [provider]  aspirin EC 81 MG tablet Take 1 tablet (81 mg total) by mouth daily. 10/28/17   Kerman Passey, MD  atorvastatin (LIPITOR) 40 MG tablet Take by mouth. 02/25/22   [provider]  celecoxib (CELEBREX) 100 MG capsule Take 100 mg by mouth 2 (two) times daily.    [provider]  celecoxib (CELEBREX) 100 MG capsule Take by  mouth. 11/04/22 08/01/23  [provider]  Cholecalciferol (VITAMIN D3) 125 MCG (5000 UT) CAPS Take 1 capsule (5,000 Units total) by mouth daily with breakfast. Take along with calcium and magnesium. 06/24/23 09/22/23  Delano Metz, MD  docusate sodium (COLACE) 100 MG capsule Take 100 mg by mouth 2 (two) times daily.    [provider]  doxycycline (VIBRA-TABS) 100 MG tablet Take 1 tablet by mouth 2 (two) times daily.    [provider]  ergocalciferol (VITAMIN D2) 1.25 MG (50000 UT) capsule Take 1 capsule (50,000 Units total) by mouth 2 (two) times a week. X 6 weeks. 06/25/23 08/06/23  Delano Metz, MD  fluticasone (FLONASE) 50 MCG/ACT nasal spray Place 1 spray into both nostrils daily. Patient taking differently: Place 1 spray into both nostrils as needed. 10/13/17   Loleta Rose, MD  fluticasone (FLONASE) 50 MCG/ACT nasal spray Use 2 sprays into each nostril daily. 02/25/22   [provider]  guaiFENesin (MUCINEX) 600 MG 12 hr tablet Take 1 tablet (600 mg total) by mouth 2 (two) times daily. 05/19/23   Sherlyn Hay, DO  hydrochlorothiazide (MICROZIDE) 12.5 MG capsule TAKE 1 CAPSULE BY MOUTH EVERY DAY 04/14/23   Pardue, Monico Blitz, DO  ipratropium-albuterol (DUONEB) 0.5-2.5 (3) MG/3ML SOLN Take 3 mLs by nebulization.    [provider]  Naltrexone-buPROPion HCl ER  8-90 MG TB12 Contrave 8 mg-90 mg tablet,extended release    [provider]  polyethylene glycol-electrolytes (NULYTELY) 420 g solution Take by mouth. 06/23/23   [provider]  rosuvastatin (CRESTOR) 40 MG tablet Take 1 tablet (40 mg total) by mouth daily. 03/31/23   Pardue, Monico Blitz, DO  Semaglutide, 1 MG/DOSE, (OZEMPIC, 1 MG/DOSE,) 4 MG/3ML SOPN Inject 1 mg into the skin once a week. Inject into the skin. 05/08/23 04/02/24  Debera Lat, PA-C  valsartan (DIOVAN) 320 MG tablet Take by mouth. 10/29/21 09/15/23  [provider]  Vitamin D, Ergocalciferol, (DRISDOL)  1.25 MG (50000 UNIT) CAPS capsule Take 1 capsule (50,000 Units total) by mouth every 7 (seven) days. 05/19/23   Sherlyn Hay, DO    Allergies as of 06/15/2023   (No Known Allergies)    Family History  Problem Relation Age of Onset   Multiple sclerosis Mother    Clotting disorder Mother    Hyperlipidemia Mother    Stroke Mother    Osteoporosis Mother    Alcohol abuse Father    Cirrhosis Father    Alcohol abuse Brother    Cirrhosis Brother    Heart attack Maternal Grandmother    Diabetes Paternal Grandmother     Social History   Socioeconomic History   Marital status: Divorced    Spouse name: Not on file   Number of children: Not on file   Years of education: Not on file   Highest education level: 12th grade  Occupational History   Not on file  Tobacco Use   Smoking status: Every Day    Current packs/day: 0.25    Average packs/day: 0.3 packs/day for 25.7 years (6.4 ttl pk-yrs)    Types: Cigarettes    Start date: 10/28/1997   Smokeless tobacco: Never  Vaping Use   Vaping status: Never Used  Substance and Sexual Activity   Alcohol use: No   Drug use: No   Sexual activity: Not Currently  Other Topics Concern   Not on file  Social History Narrative   Not on file   Social Drivers of Health   Financial Resource Strain: Patient Declined (06/29/2023)   Overall Financial Resource Strain (CARDIA)    Difficulty of Paying Living Expenses: Patient declined  Food Insecurity: Patient Declined (06/29/2023)   Hunger Vital Sign    Worried About Running Out of Food in the Last Year: Patient declined    Ran Out of Food in the Last Year: Patient declined  Recent Concern: Food Insecurity - Food Insecurity Present (04/06/2023)   Hunger Vital Sign    Worried About Running Out of Food in the Last Year: Patient declined    Ran Out of Food in the Last Year: Sometimes true  Transportation Needs: Unmet Transportation Needs (06/29/2023)   PRAPARE - Scientist, research (physical sciences) (Medical): Yes    Lack of Transportation (Non-Medical): Patient declined  Physical Activity: Unknown (06/29/2023)   Exercise Vital Sign    Days of Exercise per Week: 0 days    Minutes of Exercise per Session: Not on file  Stress: No Stress Concern Present (06/29/2023)   [employer] of Occupational Health - Occupational Stress Questionnaire    Feeling of Stress : Only a little  Recent Concern: Stress - Stress Concern Present (04/06/2023)   [employer] of Occupational Health - Occupational Stress Questionnaire    Feeling of Stress : To some extent  Social Connections: Moderately Isolated (06/29/2023)   Social Connection  and Isolation Panel [NHANES]    Frequency of Communication with Friends and Family: Three times a week    Frequency of Social Gatherings with Friends and Family: Once a week    Attends Religious Services: Never    Database administrator or Organizations: Yes    Attends Banker Meetings: Never    Marital Status: Divorced  Catering manager Violence: Not on file    Review of Systems: See HPI, otherwise negative ROS  Physical Exam: BP (!) 141/91   Pulse 94   Temp (!) 96.3 F (35.7 C) (Temporal)   Resp 20   Ht 5\' 3"  (1.6 m)   Wt 98.4 kg   SpO2 98%   BMI 38.44 kg/m  General:   Alert,  pleasant and cooperative in NAD Head:  Normocephalic and atraumatic. Neck:  Supple; no masses or thyromegaly. Lungs:  Clear throughout to auscultation, normal respiratory effort.    Heart:  +S1, +S2, Regular rate and rhythm, No edema. Abdomen:  Soft, nontender and nondistended. Normal bowel sounds, without guarding, and without rebound.   Neurologic:  Alert and  oriented x4;  grossly normal neurologically.  Impression/Plan: Brittany Castro is here for an endoscopy and colonoscopy  to be performed for  evaluation of dysphagia and colon cancer screening with prior polyps    Risks, benefits, limitations, and alternatives regarding endoscopy have  been reviewed with the patient.  Questions have been answered.  All parties agreeable.   Wyline Mood, MD  07/16/2023, 8:38 AM dy

## 2023-07-17 ENCOUNTER — Encounter: Payer: Self-pay | Admitting: Gastroenterology

## 2023-07-17 LAB — SURGICAL PATHOLOGY

## 2023-07-22 ENCOUNTER — Other Ambulatory Visit: Payer: Self-pay | Admitting: *Deleted

## 2023-07-22 ENCOUNTER — Inpatient Hospital Stay
Admission: RE | Admit: 2023-07-22 | Discharge: 2023-07-22 | Disposition: A | Discharge status: Home / Self Care | Payer: Self-pay | Source: Ambulatory Visit | Attending: Physician Assistant | Admitting: Physician Assistant

## 2023-07-22 DIAGNOSIS — Z1231 Encounter for screening mammogram for malignant neoplasm of breast: Secondary | ICD-10-CM

## 2023-07-23 ENCOUNTER — Ambulatory Visit
Admission: RE | Admit: 2023-07-23 | Discharge: 2023-07-23 | Disposition: A | Discharge status: Home / Self Care | Source: Ambulatory Visit | Attending: Pain Medicine | Admitting: Pain Medicine

## 2023-07-23 ENCOUNTER — Ambulatory Visit: Payer: Medicaid Other | Attending: Pain Medicine | Admitting: Pain Medicine

## 2023-07-23 ENCOUNTER — Encounter: Payer: Self-pay | Admitting: Pain Medicine

## 2023-07-23 VITALS — BP 138/95 | HR 102 | Temp 97.5°F | Resp 16 | Ht 63.0 in | Wt 217.0 lb

## 2023-07-23 DIAGNOSIS — M5459 Other low back pain: Secondary | ICD-10-CM | POA: Insufficient documentation

## 2023-07-23 DIAGNOSIS — M5386 Other specified dorsopathies, lumbar region: Secondary | ICD-10-CM | POA: Insufficient documentation

## 2023-07-23 DIAGNOSIS — R937 Abnormal findings on diagnostic imaging of other parts of musculoskeletal system: Secondary | ICD-10-CM | POA: Diagnosis present

## 2023-07-23 DIAGNOSIS — M47817 Spondylosis without myelopathy or radiculopathy, lumbosacral region: Secondary | ICD-10-CM | POA: Insufficient documentation

## 2023-07-23 DIAGNOSIS — M25551 Pain in right hip: Secondary | ICD-10-CM | POA: Diagnosis present

## 2023-07-23 DIAGNOSIS — M545 Low back pain, unspecified: Secondary | ICD-10-CM | POA: Insufficient documentation

## 2023-07-23 DIAGNOSIS — M47816 Spondylosis without myelopathy or radiculopathy, lumbar region: Secondary | ICD-10-CM | POA: Diagnosis present

## 2023-07-23 DIAGNOSIS — M532X6 Spinal instabilities, lumbar region: Secondary | ICD-10-CM | POA: Insufficient documentation

## 2023-07-23 DIAGNOSIS — M4316 Spondylolisthesis, lumbar region: Secondary | ICD-10-CM | POA: Diagnosis present

## 2023-07-23 DIAGNOSIS — G8929 Other chronic pain: Secondary | ICD-10-CM | POA: Diagnosis present

## 2023-07-23 MED ORDER — LIDOCAINE HCL 2 % IJ SOLN
INTRAMUSCULAR | Status: AC
  Filled 2023-07-23: qty 20

## 2023-07-23 MED ORDER — TRIAMCINOLONE ACETONIDE 40 MG/ML IJ SUSP
INTRAMUSCULAR | Status: AC
  Filled 2023-07-23: qty 2

## 2023-07-23 MED ORDER — LIDOCAINE HCL 2 % IJ SOLN
20.0000 mL | Freq: Once | INTRAMUSCULAR | Status: AC
  Administered 2023-07-23: 400 mg

## 2023-07-23 MED ORDER — ROPIVACAINE HCL 2 MG/ML IJ SOLN
18.0000 mL | Freq: Once | INTRAMUSCULAR | Status: AC
  Administered 2023-07-23: 18 mL via PERINEURAL

## 2023-07-23 MED ORDER — MIDAZOLAM HCL 5 MG/5ML IJ SOLN
INTRAMUSCULAR | Status: AC
Start: 2023-07-23 — End: ?
  Filled 2023-07-23: qty 5

## 2023-07-23 MED ORDER — FENTANYL CITRATE (PF) 100 MCG/2ML IJ SOLN
INTRAMUSCULAR | Status: AC
  Filled 2023-07-23: qty 2

## 2023-07-23 MED ORDER — ROPIVACAINE HCL 2 MG/ML IJ SOLN
INTRAMUSCULAR | Status: AC
Start: 2023-07-23 — End: ?
  Filled 2023-07-23: qty 20

## 2023-07-23 MED ORDER — PENTAFLUOROPROP-TETRAFLUOROETH EX AERO: INHALATION_SPRAY | Freq: Once | CUTANEOUS | Status: DC

## 2023-07-23 MED ORDER — TRIAMCINOLONE ACETONIDE 40 MG/ML IJ SUSP
80.0000 mg | Freq: Once | INTRAMUSCULAR | Status: AC
  Administered 2023-07-23: 80 mg

## 2023-07-23 MED ORDER — MIDAZOLAM HCL 5 MG/5ML IJ SOLN
0.5000 mg | Freq: Once | INTRAMUSCULAR | Status: AC
Start: 2023-07-23 — End: 2023-07-23
  Administered 2023-07-23: 2 mg via INTRAVENOUS

## 2023-07-23 MED ORDER — FENTANYL CITRATE (PF) 100 MCG/2ML IJ SOLN: 25.0000 ug | INTRAMUSCULAR | Status: DC | PRN

## 2023-07-23 NOTE — Patient Instructions (Signed)
 ______________________________________________________________________    Post-Procedure Discharge Instructions  Instructions: Apply ice:  Purpose: This will minimize any swelling and discomfort after procedure.  When: Day of procedure, as soon as you get home. How: Fill a plastic sandwich bag with crushed ice. Cover it with a small towel and apply to injection site. How long: (15 min on, 15 min off) Apply for 15 minutes then remove x 15 minutes.  Repeat sequence on day of procedure, until you go to bed. Apply heat:  Purpose: To treat any soreness and discomfort from the procedure. When: Starting the next day after the procedure. How: Apply heat to procedure site starting the day following the procedure. How long: May continue to repeat daily, until discomfort goes away. Food intake: Start with clear liquids (like water) and advance to regular food, as tolerated.  Physical activities: Keep activities to a minimum for the first 8 hours after the procedure. After that, then as tolerated. Driving: If you have received any sedation, be responsible and do not drive. You are not allowed to drive for 24 hours after having sedation. Blood thinner: (Applies only to those taking blood thinners) You may restart your blood thinner 6 hours after your procedure. Insulin: (Applies only to Diabetic patients taking insulin) As soon as you can eat, you may resume your normal dosing schedule. Infection prevention: Keep procedure site clean and dry. Shower daily and clean area with soap and water. Post-procedure Pain Diary: Extremely important that this be done correctly and accurately. Recorded information will be used to determine the next step in treatment. For the purpose of accuracy, follow these rules: Evaluate only the area treated. Do not report or include pain from an untreated area. For the purpose of this evaluation, ignore all other areas of pain, except for the treated area. After your procedure,  avoid taking a long nap and attempting to complete the pain diary after you wake up. Instead, set your alarm clock to go off every hour, on the hour, for the initial 8 hours after the procedure. Document the duration of the numbing medicine, and the relief you are getting from it. Do not go to sleep and attempt to complete it later. It will not be accurate. If you received sedation, it is likely that you were given a medication that may cause amnesia. Because of this, completing the diary at a later time may cause the information to be inaccurate. This information is needed to plan your care. Follow-up appointment: Keep your post-procedure follow-up evaluation appointment after the procedure (usually 2 weeks for most procedures, 6 weeks for radiofrequencies). DO NOT FORGET to bring you pain diary with you.   Expect: (What should I expect to see with my procedure?) From numbing medicine (AKA: Local Anesthetics): Numbness or decrease in pain. You may also experience some weakness, which if present, could last for the duration of the local anesthetic. Onset: Full effect within 15 minutes of injected. Duration: It will depend on the type of local anesthetic used. On the average, 1 to 8 hours.  From steroids (Applies only if steroids were used): Decrease in swelling or inflammation. Once inflammation is improved, relief of the pain will follow. Onset of benefits: Depends on the amount of swelling present. The more swelling, the longer it will take for the benefits to be seen. In some cases, up to 10 days. Duration: Steroids will stay in the system x 2 weeks. Duration of benefits will depend on multiple posibilities including persistent irritating factors. Side-effects: If  present, they may typically last 2 weeks (the duration of the steroids). Frequent: Cramps (if they occur, drink Gatorade and take over-the-counter Magnesium 450-500 mg once to twice a day); water retention with temporary weight gain;  increases in blood sugar; decreased immune system response; increased appetite. Occasional: Facial flushing (red, warm cheeks); mood swings; menstrual changes. Uncommon: Long-term decrease or suppression of natural hormones; bone thinning. (These are more common with higher doses or more frequent use. This is why we prefer that our patients avoid having any injection therapies in other practices.)  Very Rare: Severe mood changes; psychosis; aseptic necrosis. From procedure: Some discomfort is to be expected once the numbing medicine wears off. This should be minimal if ice and heat are applied as instructed.  Call if: (When should I call?) You experience numbness and weakness that gets worse with time, as opposed to wearing off. New onset bowel or bladder incontinence. (Applies only to procedures done in the spine)  Emergency Numbers: Durning business hours (Monday - Thursday, 8:00 AM - 4:00 PM) (Friday, 9:00 AM - 12:00 Noon): [phone] After hours: [phone] NOTE: If you are having a problem and are unable connect with, or to talk to a provider, then go to your nearest urgent care or emergency department. If the problem is serious and urgent, please call 911. ______________________________________________________________________      ____________________________________________________________________________________________  Spondylolisthesis  Spondylolisthesis is a condition that occurs when a vertebra in the spine slips out of place, usually in the lower back. Symptoms can vary from mild to severe, and a person may have no symptoms.  Some common symptoms include:  Back pain, especially chronic pain  Pain that radiates down the legs  Pain that worsens with exercise  Tightness in the hamstrings  Neck stiffness  Loss of spine flexibility  Weakness in the legs or trouble walking  Numbness and tingling in the groin and/or buttocks   Some causes of spondylolisthesis  include: Birth defects. Sudden injury. Abnormal wear on the cartilage and bones, such as arthritis. Bone disease and fractures. Certain sports activities, such as gymnastics, weightlifting, and football.  A doctor can diagnose spondylolisthesis with a physical exam, X-rays, and possibly a CT scan.    Forward slippage is known as "Anterolisthesis".  Backward slippage is known as "Retrolisthesis".   Pathophysiology of Spondylolisthesis:   Grading Classification of Spondylolisthesis Grade I spondylolisthesis is 1 to 25% slippage, grade II is up to 50% slippage, grade III is up to 75% slippage, and grade IV is 76-100% slippage. If there is more than 100% slippage, it is known as spondyloptosis or grade V spondylolisthesis.       ____________________________________________________________________________________________    ______________________________________________________________________    Patient information on: Body mass index (BMI) and Weight Management  Dear Brittany Castro you are receiving this information because your weight may be adversely affecting your health.   Your current Estimated body mass index is 38.44 kg/m as calculated from the following:   Height as of 07/16/23: 5\' 3"  (1.6 m).   Weight as of 07/16/23: 217 lb (98.4 kg).  We recommend you talk to your primary care physician about providing or referring you to a supervised weight management program.  Here is some information about weight and the body mass index (BMI) classification:  BMI is a measure of obesity that's calculated by dividing a person's weight in kilograms by their height in meters squared. A person can use an online calculator to determine their BMI. Body mass index (BMI)  is a common tool for deciding whether a person has an appropriate body weight.  It measures a person's weight in relation to their height.  According to the [facility] of health (NIH): A BMI of less than 18.5 means  that a person is underweight. A BMI of between 18.5 and 24.9 is ideal. A BMI of between 25 and 29.9 is overweight. A BMI over 30 indicates obesity.  Body Mass Index (BMI) Classification BMI level (kg/m2) Category Associated incidence of chronic pain  <18  Underweight   18.5-24.9 Ideal body weight   25-29.9 Overweight  20%  30-34.9 Obese (Class I)  68%  35-39.9 Severe obesity (Class II)  136%  >40 Extreme obesity (Class III)  254%    Morbidly Obese Classification: You will be considered to be "Morbidly Obese" if your BMI is above 30 and you have one or more of the following conditions caused or associated to obesity: 1.    Type 2 Diabetes (Leading to cardiovascular diseases (CVD), stroke, peripheral vascular diseases (PVD), retinopathy, nephropathy, and neuropathy) 2.    Cardiovascular Disease (High Blood Pressure; Congestive Heart Failure; High Cholesterol; Coronary Artery Disease; Angina; Arrhythmias, Dysrhythmias, or Heart Attacks) 3.    Breathing problems (Asthma; obesity-hypoventilation syndrome; obstructive sleep apnea; chronic inflammatory airway disease; reactive airway disease; or shortness of breath) 4.    Chronic kidney disease 5.    Liver disease (nonalcoholic fatty liver disease) 6.    High blood pressure 7.    Acid reflux (gastroesophageal reflux disease; heartburn) 8.    Osteoarthritis (OA) (affecting the hip(s), the knee(s) and/or the lower back) (usually requiring knee and/or hip replacements, as well as back surgeries) 9.    Low back pain (Lumbar Facet Syndrome; and/or Degenerative Disc Disease) 10.  Hip pain (Osteoarthritis of hip) (For every 1 lbs of added body weight, there is a 2 lbs increase in pressure inside of each hip articulation. 1:2 mechanical relationship) 11.  Knee pain (Osteoarthritis of knee) (For every 1 lbs of added body weight, there is a 4 lbs increase in pressure inside of each knee articulation. 1:4 mechanical relationship) (patients with a BMI>30  kg/m2 were 6.8 times more likely to develop knee OA than normal-weight individuals) 12.  Cancer: Epidemiological studies have shown that obesity is a risk factor for: post-menopausal breast cancer; cancers of the endometrium, colon and kidney cancer; malignant adenomas of the esophagus. Obese subjects have an approximately 1.5-3.5-fold increased risk of developing these cancers compared with normal-weight subjects, and it has been estimated that between 15 and 45% of these cancers can be attributed to overweight. More recent studies suggest that obesity may also increase the risk of other types of cancer, including pancreatic, hepatic and gallbladder cancer. (Ref: Obesity and cancer. Pischon T, Nthlings U, [employer] H. Proc Nutr Soc. 2008 May;67(2):128-45. doi: 10.1017/S0029665108006976.) The International Agency for Research on Cancer (IARC) has identified 13 cancers associated with overweight and obesity: meningioma, multiple myeloma, adenocarcinoma of the esophagus, and cancers of the thyroid, postmenopausal breast cancer, gallbladder, stomach, liver, pancreas, kidney, ovaries, uterus, colon and rectal (colorectal) cancers. 55 percent of all cancers diagnosed in women and 24 percent of those diagnosed in men are associated with overweight and obesity.  Recommendation: If you have any of the above conditions it is urgent that you take a step back and concentrate in losing weight. Dedicate 100% of your efforts on this task. Nothing else will improve your health more than bringing your weight down and your BMI to less than  30.   Nutritionist and/or supervised weight-management program: We are aware that most chronic pain patients are unable to exercise secondary to their pain. For this reason, you must rely on proper nutrition and diet in order to lose the weight. We recommend you talk to a nutritionist.   Bariatric surgery: A person might be considered a candidate for bariatric surgery if they meet one of the  following BMI criteria:  BMI of 40 or higher: This is considered extreme obesity (Class III). BMI of 35-39.9: This is considered obesity, and the person might also have a serious weight-related health condition, such as high blood pressure, type 2 diabetes, or severe sleep apnea  BMI of 30-34.9: This might be considered if the person has serious weight-related health problems and hasn't had substantial weight loss or improvement in co-morbidities through other methods   On your own: A realistic goal is to lose 10% of your body weight over a period of 12 months.  If over a period of six (6) months you have unsuccessfully tried to lose weight, then it is time for you to seek professional help and to enter a medically supervised weight management program, and/or undergo bariatric surgery.   Pain management considerations and possible limitations:  1.    Pharmacological Problems: Be advised that the use of opioid analgesics (oxycodone; hydrocodone; morphine; methadone; codeine; and all of their derivatives) have been associated with decreased metabolism and weight gain.  For this reason, should we see that you are unable to lose weight while taking these medications, it may become necessary for [place] to taper down and indefinitely discontinue them.  2.    Technical Problems: The incidence of successful interventional therapies decreases as the patient's BMI increases. It is much more difficult to accomplish a safe and effective interventional therapy on a patient with a BMI above 35. 3.    Radiation Exposure Problems: The x-rays machine, used to accomplish injection therapies, will automatically increase their x-ray output in order to capture an appropriate bone image. This means that radiation exposure increases exponentially with the patient's BMI. (The higher the BMI, the higher the radiation exposure.) Although the level of radiation used at a given time is still safe to the patient, it is not for the  physician and/or assisting staff. Unfortunately, radiation exposure is accumulative. Because physicians and the staff have to do procedures and be exposed on a daily basis, this can result in health problems such as cancer and radiation burns. Radiation exposure to the staff is monitored by the radiation batches that they wear. The exposure levels are reported back to the staff on a quarterly basis. Depending on levels of exposure, physicians and staff may be obligated by law to decrease this exposure. This means that they have the right and obligation to refuse providing therapies where they may be overexposed to radiation. For this reason, physicians may decline to offer therapies such as radiofrequency ablation or implants to patients with a BMI above 40. 4.    Current Trends: Be advised that the current trend is to no longer offer certain therapies to patients with a BMI equal to, or above 35, due to increase perioperative risks, increased technical procedural difficulties, and excessive radiation exposure to healthcare personnel.  Last updated: 02/09/2023 ______________________________________________________________________

## 2023-07-23 NOTE — Progress Notes (Signed)
 PROVIDER NOTE: Interpretation of information contained herein should be left to medically-trained personnel. Specific patient instructions are provided elsewhere under "Patient Instructions" section of medical record. This document was created in part using STT-dictation technology, any transcriptional errors that may result from this process are unintentional.  Patient: Brittany Castro Type: Established DOB: [birth year] MRN: [id number] PCP: Sherlyn Hay, DO  Service: Procedure DOS: 07/23/2023 Setting: Ambulatory Location: Ambulatory outpatient facility Delivery: Face-to-face Provider: Oswaldo Done, MD Specialty: Interventional Pain Management Specialty designation: 09 Location: Outpatient facility Ref. Prov.: Sherlyn Hay, DO       Interventional Therapy   Type: Lumbar Facet, Medial Branch Block(s) (w/ fluoroscopic mapping) #1  Laterality: Bilateral  Level: L3, L4, L5, and S1 Medial Branch Level(s). Injecting these levels blocks the L4-5 and L5-S1 lumbar facet joints. Imaging: Fluoroscopic guidance Spinal ([id number]) Anesthesia: Local anesthesia (1-2% Lidocaine) Anxiolysis: IV Versed 2.0 mg Sedation: Moderate Sedation None required. No Fentanyl administered.         DOS: 07/23/2023 Performed by: Oswaldo Done, MD  Primary Purpose: Diagnostic/Therapeutic Indications: Low back pain severe enough to impact quality of life or function. 1. Chronic low back pain (1ry area of Pain) (Right) w/o sciatica   2. Grade 1 Anterolisthesis of lumbar spine (L4/L5) (Unstable)   3. Lumbar facet joint pain   4. Lumbar facet hypertrophy   5. Lumbar facet joint syndrome   6. Lumbar facet arthropathy   7. 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 Spondylosis without myelopathy or radiculopathy, lumbosacral region   9. Decreased range of motion of lumbar spine   10. Abnormal MRI, lumbar spine (12/05/2021) ([facility])   11. Abnormal CT scan, lumbar spine (05/12/2023)   12. Abnormal x-ray of lumbar  spine (with bending views) (06/09/2023)   13. Chronic hip pain (2ry area of Pain) (Right)   14. Severe obesity (BMI 35.0-39.9) with comorbidity (HCC)    NAS-11 Pain score:   Pre-procedure: 9 /10   Post-procedure: 0-No pain/10     Position / Prep / Materials:  Position: Prone  Prep solution: ChloraPrep (2% chlorhexidine gluconate and 70% isopropyl alcohol) Area Prepped: Posterolateral Lumbosacral Spine (Wide prep: From the lower border of the scapula down to the end of the tailbone and from flank to flank.)  Materials:  Tray: Block Needle(s):  Type: Spinal  Gauge (G): 22  Length: 5-in Qty: 4     H&P (Pre-op Assessment):  Brittany Castro is a 63 y.o. (year old), female patient, seen today for interventional treatment. She  has a past surgical history that includes carpel tunnel; Tendon release; Colonoscopy with propofol (N/A, 11/16/2014); Tubal ligation (2002); Hernia repair (2002); Colposcopy; Esophagogastroduodenoscopy (egd) with propofol (N/A, 07/16/2023); Colonoscopy with propofol (N/A, 07/16/2023); and biopsy (07/16/2023). Brittany Castro has a current medication list which includes the following prescription(s): acetaminophen, albuterol, albuterol, amlodipine, aspirin ec, atorvastatin, celecoxib, celecoxib, vitamin d3, docusate sodium, doxycycline, ergocalciferol, fluticasone, fluticasone, guaifenesin, hydrochlorothiazide, ipratropium-albuterol, naltrexone-bupropion hcl er, polyethylene glycol-electrolytes, rosuvastatin, ozempic (1 mg/dose), valsartan, and vitamin d (ergocalciferol), and the following Facility-Administered Medications: fentanyl and pentafluoroprop-tetrafluoroeth. Her primarily concern today is the Back Pain  Initial Vital Signs:  Pulse/HCG Rate: 92ECG Heart Rate: 77 (nsr) Temp: (!) 97.5 F (36.4 C) Resp: 16 BP: (!) 159/99 SpO2: 98 %  BMI: Estimated body mass index is 38.44 kg/m as calculated from the following:   Height as of this encounter: 5\' 3"  (1.6 m).   Weight as of  this encounter: 217 lb (98.4 kg).  Risk Assessment: Allergies: Reviewed. She  has no known allergies.  Allergy Precautions: None required Coagulopathies: Reviewed. None identified.  Blood-thinner therapy: None at this time Active Infection(s): Reviewed. None identified. Brittany Castro is afebrile  Site Confirmation: Brittany Castro was asked to confirm the procedure and laterality before marking the site Procedure checklist: Completed Consent: Before the procedure and under the influence of no sedative(s), amnesic(s), or anxiolytics, the patient was informed of the treatment options, risks and possible complications. To fulfill our ethical and legal obligations, as recommended by the American Medical Association's Code of Ethics, I have informed the patient of my clinical impression; the nature and purpose of the treatment or procedure; the risks, benefits, and possible complications of the intervention; the alternatives, including doing nothing; the risk(s) and benefit(s) of the alternative treatment(s) or procedure(s); and the risk(s) and benefit(s) of doing nothing. The patient was provided information about the general risks and possible complications associated with the procedure. These may include, but are not limited to: failure to achieve desired goals, infection, bleeding, organ or nerve damage, allergic reactions, paralysis, and death. In addition, the patient was informed of those risks and complications associated to Spine-related procedures, such as failure to decrease pain; infection (i.e.: Meningitis, epidural or intraspinal abscess); bleeding (i.e.: epidural hematoma, subarachnoid hemorrhage, or any other type of intraspinal or peri-dural bleeding); organ or nerve damage (i.e.: Any type of peripheral nerve, nerve root, or spinal cord injury) with subsequent damage to sensory, motor, and/or autonomic systems, resulting in permanent pain, numbness, and/or weakness of one or several areas of the  body; allergic reactions; (i.e.: anaphylactic reaction); and/or death. Furthermore, the patient was informed of those risks and complications associated with the medications. These include, but are not limited to: allergic reactions (i.e.: anaphylactic or anaphylactoid reaction(s)); adrenal axis suppression; blood sugar elevation that in diabetics may result in ketoacidosis or comma; water retention that in patients with history of congestive heart failure may result in shortness of breath, pulmonary edema, and decompensation with resultant heart failure; weight gain; swelling or edema; medication-induced neural toxicity; particulate matter embolism and blood vessel occlusion with resultant organ, and/or nervous system infarction; and/or aseptic necrosis of one or more joints. Finally, the patient was informed that Medicine is not an exact science; therefore, there is also the possibility of unforeseen or unpredictable risks and/or possible complications that may result in a catastrophic outcome. The patient indicated having understood very clearly. We have given the patient no guarantees and we have made no promises. Enough time was given to the patient to ask questions, all of which were answered to the patient's satisfaction. Ms. Hornaday has indicated that she wanted to continue with the procedure. Attestation: I, the ordering provider, attest that I have discussed with the patient the benefits, risks, side-effects, alternatives, likelihood of achieving goals, and potential problems during recovery for the procedure that I have provided informed consent. Date  Time: 07/23/2023  9:00 AM   Pre-Procedure Preparation:  Monitoring: As per clinic protocol. Respiration, ETCO2, SpO2, BP, heart rate and rhythm monitor placed and checked for adequate function Safety Precautions: Patient was assessed for positional comfort and pressure points before starting the procedure. Time-out: I initiated and conducted the  "Time-out" before starting the procedure, as per protocol. The patient was asked to participate by confirming the accuracy of the "Time Out" information. Verification of the correct person, site, and procedure were performed and confirmed by me, the nursing staff, and the patient. "Time-out" conducted as per Joint Commission's Universal Protocol (UP.01.01.01). Time: [phone] Start  Time: 0942 hrs.  Description of Procedure:          Laterality: (see above) Targeted Levels: (see above)  Safety Precautions: Aspiration looking for blood return was conducted prior to all injections. At no point did we inject any substances, as a needle was being advanced. Before injecting, the patient was told to immediately notify me if she was experiencing any new onset of "ringing in the ears, or metallic taste in the mouth". No attempts were made at seeking any paresthesias. Safe injection practices and needle disposal techniques used. Medications properly checked for expiration dates. SDV (single dose vial) medications used. After the completion of the procedure, all disposable equipment used was discarded in the proper designated medical waste containers. Local Anesthesia: Protocol guidelines were followed. The patient was positioned over the fluoroscopy table. The area was prepped in the usual manner. The time-out was completed. The target area was identified using fluoroscopy. A 12-in long, straight, sterile hemostat was used with fluoroscopic guidance to locate the targets for each level blocked. Once located, the skin was marked with an approved surgical skin marker. Once all sites were marked, the skin (epidermis, dermis, and hypodermis), as well as deeper tissues (fat, connective tissue and muscle) were infiltrated with a small amount of a short-acting local anesthetic, loaded on a 10cc syringe with a 25G, 1.5-in  Needle. An appropriate amount of time was allowed for local anesthetics to take effect before proceeding to  the next step. Local Anesthetic: Lidocaine 2.0% The unused portion of the local anesthetic was discarded in the proper designated containers. Technical description of process:  L2 Medial Branch Nerve Block (MBB): The target area for the L2 medial branch is at the junction of the postero-lateral aspect of the superior articular process and the superior, posterior, and medial edge of the transverse process of L3. Under fluoroscopic guidance, a Quincke needle was inserted until contact was made with os over the superior postero-lateral aspect of the pedicular shadow (target area). After negative aspiration for blood, 0.5 mL of the nerve block solution was injected without difficulty or complication. The needle was removed intact. L3 Medial Branch Nerve Block (MBB): The target area for the L3 medial branch is at the junction of the postero-lateral aspect of the superior articular process and the superior, posterior, and medial edge of the transverse process of L4. Under fluoroscopic guidance, a Quincke needle was inserted until contact was made with os over the superior postero-lateral aspect of the pedicular shadow (target area). After negative aspiration for blood, 0.5 mL of the nerve block solution was injected without difficulty or complication. The needle was removed intact. L4 Medial Branch Nerve Block (MBB): The target area for the L4 medial branch is at the junction of the postero-lateral aspect of the superior articular process and the superior, posterior, and medial edge of the transverse process of L5. Under fluoroscopic guidance, a Quincke needle was inserted until contact was made with os over the superior postero-lateral aspect of the pedicular shadow (target area). After negative aspiration for blood, 0.5 mL of the nerve block solution was injected without difficulty or complication. The needle was removed intact. L5 Medial Branch Nerve Block (MBB): The target area for the L5 medial branch is at the  junction of the postero-lateral aspect of the superior articular process and the superior, posterior, and medial edge of the sacral ala. Under fluoroscopic guidance, a Quincke needle was inserted until contact was made with os over the superior postero-lateral aspect of the  pedicular shadow (target area). After negative aspiration for blood, 0.5 mL of the nerve block solution was injected without difficulty or complication. The needle was removed intact. S1 Medial Branch Nerve Block (MBB): The target area for the S1 medial branch is at the posterior and inferior 6 o'clock position of the L5-S1 facet joint. Under fluoroscopic guidance, the Quincke needle inserted for the L5 MBB was redirected until contact was made with os over the inferior and postero aspect of the sacrum, at the 6 o' clock position under the L5-S1 facet joint (Target area). After negative aspiration for blood, 0.5 mL of the nerve block solution was injected without difficulty or complication. The needle was removed intact.  Once the entire procedure was completed, the treated area was cleaned, making sure to leave some of the prepping solution back to take advantage of its long term bactericidal properties.         Illustration of the posterior view of the lumbar spine and the posterior neural structures. Laminae of L2 through S1 are labeled. DPRL5, dorsal primary ramus of L5; DPRS1, dorsal primary ramus of S1; DPR3, dorsal primary ramus of L3; FJ, facet (zygapophyseal) joint L3-L4; I, inferior articular process of L4; LB1, lateral branch of dorsal primary ramus of L1; IAB, inferior articular branches from L3 medial branch (supplies L4-L5 facet joint); IBP, intermediate branch plexus; MB3, medial branch of dorsal primary ramus of L3; NR3, third lumbar nerve root; S, superior articular process of L5; SAB, superior articular branches from L4 (supplies L4-5 facet joint also); TP3, transverse process of L3.   Facet Joint Innervation (*  possible contribution)  L1-2 T12, L1 (L2*)  Medial Branch  L2-3 L1, L2 (L3*)         "          "  L3-4 L2, L3 (L4*)         "          "  L4-5 L3, L4 (L5*)         "          "  L5-S1 L4, L5, S1          "          "    Vitals:   07/23/23 0939 07/23/23 0944 07/23/23 0949 07/23/23 1001  BP: (!) 174/105 (!) 183/108 (!) 157/107 (!) 138/95  Pulse:    (!) 102  Resp: 18 17 18 16   Temp:      TempSrc:      SpO2: 100% 100% 100% 98%  Weight:      Height:         End Time: 0949 hrs.  Imaging Guidance (Spinal):          Type of Imaging Technique: Fluoroscopy Guidance (Spinal) Indication(s): Fluoroscopy guidance for needle placement to enhance accuracy in procedures requiring precise needle localization for targeted delivery of medication in or near specific anatomical locations not easily accessible without such real-time imaging assistance. Exposure Time: Please see nurses notes. Contrast: None used. Fluoroscopic Guidance: I was personally present during the use of fluoroscopy. "Tunnel Vision Technique" used to obtain the best possible view of the target area. Parallax error corrected before commencing the procedure. "Direction-depth-direction" technique used to introduce the needle under continuous pulsed fluoroscopy. Once target was reached, antero-posterior, oblique, and lateral fluoroscopic projection used confirm needle placement in all planes. Images permanently stored in EMR. Interpretation: No contrast injected. I personally interpreted the imaging intraoperatively. Adequate needle placement confirmed  in multiple planes. Permanent images saved into the patient's record.  Post-operative Assessment:  Post-procedure Vital Signs:  Pulse/HCG Rate: (!) 102100 (nsr) Temp: (!) 97.5 F (36.4 C) Resp: 16 BP: (!) 138/95 SpO2: 98 %  EBL: None  Complications: No immediate post-treatment complications observed by team, or reported by patient.  Note: The patient tolerated the entire  procedure well. A repeat set of vitals were taken after the procedure and the patient was kept under observation following institutional policy, for this type of procedure. Post-procedural neurological assessment was performed, showing return to baseline, prior to discharge. The patient was provided with post-procedure discharge instructions, including a section on how to identify potential problems. Should any problems arise concerning this procedure, the patient was given instructions to immediately contact us, at any time, without hesitation. In any case, we plan to contact the patient by telephone for a follow-up status report regarding this interventional procedure.  Comments:  No additional relevant information.  Plan of Care (POC)  Orders:  Orders Placed This Encounter  Procedures   LUMBAR FACET(MEDIAL BRANCH NERVE BLOCK) MBNB    Scheduling Instructions:     Procedure: Lumbar facet block (AKA.: Lumbosacral medial branch nerve block)     Side: Bilateral     Level: L3-4, L4-5, L5-S1, and TBD Facets (L2, L3, L4, L5, S1, and TBD Medial Branch Nerves)     Sedation: Patient's choice.     Timeframe: Today    Where will this procedure be performed?:   [facility]   DG [facility] C-ARM 1-60 MIN NO REPORT    Intraoperative interpretation by procedural physician at [facility].    Standing Status:   Standing    Number of Occurrences:   1    Reason for exam::   Assistance in needle guidance and placement for procedures requiring needle placement in or near specific anatomical locations not easily accessible without such assistance.   Informed Consent Details: Physician/Practitioner Attestation; Transcribe to consent form and obtain patient signature    Nursing Order: Transcribe to consent form and obtain patient signature. Note: Always confirm laterality of pain with Ms. Anctil, before procedure.    Physician/Practitioner attestation of informed consent for procedure/surgical  case:   I, the physician/practitioner, attest that I have discussed with the patient the benefits, risks, side effects, alternatives, likelihood of achieving goals and potential problems during recovery for the procedure that I have provided informed consent.    Procedure:   Lumbar Facet Block  under fluoroscopic guidance    Physician/Practitioner performing the procedure:   Dinesh Ulysse A. Laban Emperor MD    Indication/Reason:   Low Back Pain, with our without leg pain, due to Facet Joint Arthralgia (Joint Pain) Spondylosis (Arthritis of the Spine), without myelopathy or radiculopathy (Nerve Damage).   Provide equipment / supplies at bedside    Procedure tray: "Block Tray" (Disposable  single use) Skin infiltration needle: Regular 1.5-in, 25-G, (x1) Block Needle type: Spinal Amount/quantity: 4 Size: Medium (5-inch) Gauge: 22G    Standing Status:   Standing    Number of Occurrences:   1    Specify:   Block Tray   Saline lock IV    Have LR [id number] mL available and administer at 125 mL/hr if patient becomes hypotensive.    Standing Status:   Standing    Number of Occurrences:   1   Chronic Opioid Analgesic:   No chronic opioid analgesics therapy prescribed by our practice. None MME/day: 0 mg/day   Medications  ordered for procedure: Meds ordered this encounter  Medications   lidocaine (XYLOCAINE) 2 % (with pres) injection 400 mg   pentafluoroprop-tetrafluoroeth (GEBAUERS) aerosol   midazolam (VERSED) 5 MG/5ML injection 0.5-2 mg    Make sure Flumazenil is available in the pyxis when using this medication. If oversedation occurs, administer 0.2 mg IV over 15 sec. If after 45 sec no response, administer 0.2 mg again over 1 min; may repeat at 1 min intervals; not to exceed 4 doses (1 mg)   fentaNYL (SUBLIMAZE) injection 25-50 mcg    Make sure Narcan is available in the pyxis when using this medication. In the event of respiratory depression (RR< 8/min): Titrate NARCAN (naloxone) in increments of  0.1 to 0.2 mg IV at 2-3 minute intervals, until desired degree of reversal.   ropivacaine (PF) 2 mg/mL (0.2%) (NAROPIN) injection 18 mL   triamcinolone acetonide (KENALOG-40) injection 80 mg   Medications administered: We administered lidocaine, midazolam, ropivacaine (PF) 2 mg/mL (0.2%), and triamcinolone acetonide.  See the medical record for exact dosing, route, and time of administration.  Follow-up plan:   Return in about 2 weeks (around 08/06/2023) for (Face2F), (PPE).       Interventional Therapies  Risk Factors  Considerations  Medical Comorbidities:  MO (BMI>35)  COPD (BA, CB)  T2NIDDM  HTN     Planned  Pending:   Diagnostic bilateral (L3-4, L4-5, L5-S1) lumbar facet MBB #1    Under consideration:   Diagnostic bilateral (L3-4, L4-5, L5-S1) lumbar facet MBB #1  Neurosurgery consult secondary to L4-5 instability. Diagnostic CT vs MRI of thoracic spine to evaluate thoracic back pain and abdominal pain (thoracic radiculitis/radiculopathy).   Completed:   (04/20/2023) order for physical therapy entered.   Therapeutic  Palliative (PRN) options:   None established   Completed by other providers:   Diagnostic bilateral L3-4, L4-5 IA facet steroid inj. x1 (06/03/2022) (7-2/10) by Betsey Amen, MD (Dominika Regino Schultze, MD (Attending)) [facility] PM)       Recent Visits Date Type Provider Dept  06/24/23 Office Visit Delano Metz, MD [facility]  Showing recent visits within past 90 days and meeting all other requirements Today's Visits Date Type Provider Dept  07/23/23 Procedure visit Delano Metz, MD [facility]  Showing today's visits and meeting all other requirements Future Appointments Date Type Provider Dept  08/11/23 Appointment Delano Metz, MD [facility]  Showing future appointments within next 90 days and meeting all other requirements  Disposition: Discharge home  Discharge (Date  Time): 07/23/2023;  1014 hrs.   Primary Care Physician: Sherlyn Hay, DO Location: [facility] Note by: Oswaldo Done, MD (TTS technology used. I apologize for any typographical errors that were not detected and corrected.) Date: 07/23/2023; Time: 10:30 AM  Disclaimer:  Medicine is not an Visual merchandiser. The only guarantee in medicine is that nothing is guaranteed. It is important to note that the decision to proceed with this intervention was based on the information collected from the patient. The Data and conclusions were drawn from the patient's questionnaire, the interview, and the physical examination. Because the information was provided in large part by the patient, it cannot be guaranteed that it has not been purposely or unconsciously manipulated. Every effort has been made to obtain as much relevant data as possible for this evaluation. It is important to note that the conclusions that lead to this procedure are derived in large part from the available data. Always take  into account that the treatment will also be dependent on availability of resources and existing treatment guidelines, considered by other Pain Management Practitioners as being common knowledge and practice, at the time of the intervention. For Medico-Legal purposes, it is also important to point out that variation in procedural techniques and pharmacological choices are the acceptable norm. The indications, contraindications, technique, and results of the above procedure should only be interpreted and judged by a Board-Certified Interventional Pain Specialist with extensive familiarity and expertise in the same exact procedure and technique.

## 2023-07-24 ENCOUNTER — Telehealth: Payer: Self-pay | Admitting: *Deleted

## 2023-07-24 NOTE — Telephone Encounter (Signed)
 Post procedure call: reports that she is doing okay.

## 2023-07-28 ENCOUNTER — Inpatient Hospital Stay: Admission: RE | Admit: 2023-07-28 | Payer: Medicare Other | Source: Ambulatory Visit

## 2023-08-11 ENCOUNTER — Ambulatory Visit: Attending: Pain Medicine | Admitting: Pain Medicine

## 2023-08-11 VITALS — BP 161/89 | HR 87 | Temp 97.6°F | Resp 16 | Ht 63.0 in | Wt 218.0 lb

## 2023-08-11 DIAGNOSIS — G8929 Other chronic pain: Secondary | ICD-10-CM

## 2023-08-11 DIAGNOSIS — M5459 Other low back pain: Secondary | ICD-10-CM | POA: Diagnosis present

## 2023-08-11 DIAGNOSIS — M545 Low back pain, unspecified: Secondary | ICD-10-CM | POA: Diagnosis not present

## 2023-08-11 DIAGNOSIS — M47816 Spondylosis without myelopathy or radiculopathy, lumbar region: Secondary | ICD-10-CM

## 2023-08-11 DIAGNOSIS — Z09 Encounter for follow-up examination after completed treatment for conditions other than malignant neoplasm: Secondary | ICD-10-CM | POA: Diagnosis present

## 2023-08-11 DIAGNOSIS — M5386 Other specified dorsopathies, lumbar region: Secondary | ICD-10-CM | POA: Diagnosis not present

## 2023-08-11 NOTE — Progress Notes (Signed)
 PROVIDER NOTE: Information contained herein reflects review and annotations entered in association with encounter. Interpretation of such information and data should be left to medically-trained personnel. Information provided to patient can be located elsewhere in the medical record under "Patient Instructions". Document created using STT-dictation technology, any transcriptional errors that may result from process are unintentional.    Patient: Brittany Castro  Service Category: E/M  Provider: Oswaldo Done, MD  DOB: March 23, 1961  DOS: 08/11/2023  Referring Provider: Despina Castro  MRN: [id number]  Specialty: Interventional Pain Management  PCP: Brittany Hay, DO  Type: Established Patient  Setting: Ambulatory outpatient    Location: Office  Delivery: Face-to-face     HPI  [name], a 63 y.o. year old female, is here today because of her Chronic right-sided low back pain without sciatica [M54.50, G89.29]. [name] primary complain today is Back Pain (lower)  Pertinent problems: 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); AC (acromioclavicular) arthritis; Lumbar facet arthropathy; Muscle strain of lower extremity; Arthralgia of multiple sites; Chronic low back pain (1ry area of Pain) (Right) w/o sciatica; Tibialis posterior tendinitis; Lumbar spondylosis; Chronic pain syndrome; Abnormal MRI, lumbar spine (12/05/2021) [facility]); Grade 1 Anterolisthesis of lumbar spine (L4/L5) (Unstable); Chronic thoracic back pain (3ry area of Pain) (Right); Chronic hip pain (2ry area of Pain) (Right); Decreased range of motion of lumbar spine; Numbness and tingling of hand (intermittent) (Left); Thoracic radicular pain (Right); Lumbar facet joint pain; Lumbar facet hypertrophy; Lumbar facet joint syndrome; Spondylosis without myelopathy or radiculopathy, lumbosacral region; Abnormal CT scan, lumbar  spine (05/12/2023); Abnormal x-ray of lumbar spine (with bending views) (06/09/2023); and 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 Pain Assessment: Severity of Chronic pain is reported as a 5 /10. Location: Back Right, Left, Lower/denies, hips better with no pain. Onset: More than a month ago. Quality: Aching, Dull, Tightness. Timing: Constant. Modifying factor(s): "I feel a lot better since procedure", rest. Vitals:  height is 5\' 3"  (1.6 m) and weight is 218 lb (98.9 kg). Her temperature is 97.6 F (36.4 C). Her blood pressure is 161/89 (abnormal) and her pulse is 87. Her respiration is 16 and oxygen saturation is 99%.  BMI: Estimated body mass index is 38.62 kg/m as calculated from the following:   Height as of this encounter: 5\' 3"  (1.6 m).   Weight as of this encounter: 218 lb (98.9 kg). Last encounter: 06/24/2023. Last procedure: 07/23/2023.  Reason for encounter: post-procedure evaluation and assessment.  The patient indicates having attained 100% relief of the pain for the duration of local anesthetic followed by a decrease to an ongoing 55% improvement of her back pain.  She is extremely happy with it and for the time being she wants to hold on any further treatments.  Today I took the time to talk to her about the normal course of this type of condition and I have encouraged her to bring her BMI down to 30.  I have also entered I have also entered a PRN order for a repeat bilateral lumbar facet block.  That would be her second diagnostic injection.  Today I did take the time to talk to her about the possibility of radiofrequency ablation as an alternative in the event that the pain continues to return.  She understood and accepted.  Post-procedure evaluation   Type: Lumbar Facet, Medial Branch Block(s) (w/ fluoroscopic mapping) #1  Laterality: Bilateral  Level: L3, L4, L5, and S1 Medial Branch Level(s). Injecting these levels blocks the L4-5 and L5-S1 lumbar facet  joints. Imaging: Fluoroscopic guidance Spinal ([id number]) Anesthesia: Local anesthesia (1-2% Lidocaine) Anxiolysis: IV Versed 2.0 mg Sedation: Moderate Sedation None required. No Fentanyl administered.         DOS: 07/23/2023 Performed by: Brittany Done, MD  Primary Purpose: Diagnostic/Therapeutic Indications: Low back pain severe enough to impact quality of life or function. 1. Chronic low back pain (1ry area of Pain) (Right) w/o sciatica   2. Grade 1 Anterolisthesis of lumbar spine (L4/L5) (Unstable)   3. Lumbar facet joint pain   4. Lumbar facet hypertrophy   5. Lumbar facet joint syndrome   6. Lumbar facet arthropathy   7. 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 Spondylosis without myelopathy or radiculopathy, lumbosacral region   9. Decreased range of motion of lumbar spine   10. Abnormal MRI, lumbar spine (12/05/2021) ([facility])   11. Abnormal CT scan, lumbar spine (05/12/2023)   12. Abnormal x-ray of lumbar spine (with bending views) (06/09/2023)   13. Chronic hip pain (2ry area of Pain) (Right)   14. Severe obesity (BMI 35.0-39.9) with comorbidity (HCC)    NAS-11 Pain score:   Pre-procedure: 9 /10   Post-procedure: 0-No pain/10     Effectiveness:  Initial hour after procedure: 100 % Subsequent 4-6 hours post-procedure: 100 %. Analgesia past initial 6 hours: 55 % (current). Ongoing improvement:  Analgesic: The patient indicates having attained 100% relief of the pain for the duration of local anesthetic followed by an ongoing 55% improvement of her low back pain. Function: [name] reports improvement in function ROM: [name] reports improvement in ROM  Pharmacotherapy Assessment  Analgesic: No chronic opioid analgesics therapy prescribed by our practice. None MME/day: 0 mg/day   Monitoring: [place] PMP: PDMP reviewed during this encounter.       Pharmacotherapy: No side-effects or adverse reactions reported. Compliance: No problems identified. Effectiveness:  Clinically acceptable.  Brittany Pies, RN  08/11/2023  1:04 PM  Sign when Signing Visit Safety precautions to be maintained throughout the outpatient stay will include: orient to surroundings, keep bed in low position, maintain call bell within reach at all times, provide assistance with transfer out of bed and ambulation.     No results found for: "CBDTHCR" No results found for: "D8THCCBX" No results found for: "D9THCCBX"  UDS:  Summary  Date Value Ref Range Status  04/20/2023 FINAL  Final    Comment:    ==================================================================== Compliance Drug Analysis, Ur ==================================================================== Test                             Result       Flag       Units  Drug Present and Declared for Prescription Verification   Acetaminophen                  PRESENT      EXPECTED  Drug Present not Declared for Prescription Verification   Doxylamine                     PRESENT      UNEXPECTED   Dextromethorphan               PRESENT      UNEXPECTED   Dextrorphan/Levorphanol        PRESENT      UNEXPECTED  Dextrorphan is an expected metabolite of dextromethorphan, an over-    the-counter or prescription cough suppressant. Dextrorphan cannot be    distinguished from the scheduled prescription medication levorphanol    by the method used for analysis.  Drug Absent but Declared for Prescription Verification   Naltrexone                     Not Detected UNEXPECTED   Bupropion                      Not Detected UNEXPECTED   Salicylate                     Not Detected UNEXPECTED    Aspirin, as indicated in the declared medication list, is not always    detected even when used as directed.    Naproxen                       Not Detected UNEXPECTED ==================================================================== Test                      Result    Flag   Units      Ref Range   Creatinine              87                mg/dL      >=78 ==================================================================== Declared Medications:  The flagging and interpretation on this report are based on the  following declared medications.  Unexpected results may arise from  inaccuracies in the declared medications.   **Note: The testing scope of this panel includes these medications:   Bupropion  Naltrexone  Naproxen (Naprosyn)   **Note: The testing scope of this panel does not include small to  moderate amounts of these reported medications:   Acetaminophen (Tylenol)  Aspirin   **Note: The testing scope of this panel does not include the  following reported medications:   Albuterol (AccuNeb)  Albuterol (Duoneb)  Amlodipine (Norvasc)  Atorvastatin (Lipitor)  Celecoxib (Celebrex)  Docusate (Colace)  Doxycycline  Fluticasone (Flonase)  Hydrochlorothiazide (Microzide)  Ipratropium (Duoneb)  Rosuvastatin (Crestor)  Semaglutide (Ozempic)  Valsartan (Diovan) ==================================================================== For clinical consultation, please call [phone]. ====================================================================       ROS  Constitutional: Denies any fever or chills Gastrointestinal: No reported hemesis, hematochezia, vomiting, or acute GI distress Musculoskeletal: Denies any acute onset joint swelling, redness, loss of ROM, or weakness Neurological: No reported episodes of acute onset apraxia, aphasia, dysarthria, agnosia, amnesia, paralysis, loss of coordination, or loss of consciousness  Medication Review  Naltrexone-buPROPion HCl ER, Semaglutide (1 MG/DOSE), Vitamin D (Ergocalciferol), Vitamin D3, acetaminophen, albuterol, amLODipine, aspirin EC, atorvastatin, celecoxib, docusate sodium, doxycycline, fluticasone, guaiFENesin, hydrochlorothiazide, ipratropium-albuterol, polyethylene glycol-electrolytes, rosuvastatin, and valsartan  History Review  Allergy: [name]  has no known allergies. Drug: [name]  reports no history of drug use. Alcohol:  reports no history of alcohol use. Tobacco:  reports that she has been smoking cigarettes. She started smoking about 25 years ago. She has a 6.4 pack-year smoking history. She has never used smokeless tobacco. Social: [name]  reports that she has been smoking cigarettes. She started smoking about 25 years ago. She has a 6.4 pack-year smoking history. She has never used smokeless tobacco. She reports that she does not drink alcohol and does not use drugs. Medical:  has a past medical history of  Arthritis, Asthma, Benign hypertension (08/19/2016), Fatty liver (08/19/2016), Hypertension, and Tobacco abuse (08/19/2016). Surgical: [name]  has a past surgical history that includes carpel tunnel; Tendon release; Colonoscopy with propofol (N/A, 11/16/2014); Tubal ligation (2002); Hernia repair (2002); Colposcopy; Esophagogastroduodenoscopy (egd) with propofol (N/A, 07/16/2023); Colonoscopy with propofol (N/A, 07/16/2023); and biopsy (07/16/2023). Family: family history includes Alcohol abuse in her brother and father; Cirrhosis in her brother and father; Clotting disorder in her mother; Diabetes in her paternal grandmother; Heart attack in her maternal grandmother; Hyperlipidemia in her mother; Multiple sclerosis in her mother; Osteoporosis in her mother; Stroke in her mother.  Laboratory Chemistry Profile   Renal Lab Results  Component Value Date   BUN 10 04/20/2023   CREATININE 0.71 04/20/2023   BCR 14 04/20/2023   GFRAA >60 07/16/2018   GFRNONAA >60 07/16/2018    Hepatic Lab Results  Component Value Date   AST 21 04/20/2023   ALT 22 03/23/2023   ALBUMIN 4.2 04/20/2023   ALKPHOS 154 (H) 04/20/2023   LIPASE 33 07/16/2018    Electrolytes Lab Results  Component Value Date   NA 142 04/20/2023   K 4.1 04/20/2023   CL 105 04/20/2023   CALCIUM 9.3 04/20/2023   MG 2.2 04/20/2023    Bone Lab Results   Component Value Date   25OHVITD1 6.3 (L) 06/24/2023   25OHVITD2 <1.0 06/24/2023   25OHVITD3 5.8 06/24/2023    Inflammation (CRP: Acute Phase) (ESR: Chronic Phase) Lab Results  Component Value Date   CRP 9 04/20/2023   ESRSEDRATE 35 04/20/2023         Note: Above Lab results reviewed.  Recent Imaging Review  DG PAIN CLINIC C-ARM 1-60 MIN NO REPORT Fluoro was used, but no Radiologist interpretation will be provided.  Please refer to "NOTES" tab for provider progress note. Note: Reviewed        Physical Exam  General appearance: Well nourished, well developed, and well hydrated. In no apparent acute distress Mental status: Alert, oriented x 3 (person, place, & time)       Respiratory: No evidence of acute respiratory distress Eyes: PERLA Vitals: BP (!) 161/89   Pulse 87   Temp 97.6 F (36.4 C)   Resp 16   Ht 5\' 3"  (1.6 m)   Wt 218 lb (98.9 kg)   SpO2 99%   BMI 38.62 kg/m  BMI: Estimated body mass index is 38.62 kg/m as calculated from the following:   Height as of this encounter: 5\' 3"  (1.6 m).   Weight as of this encounter: 218 lb (98.9 kg). Ideal: Ideal body weight: 52.4 kg (115 lb 8.3 oz) Adjusted ideal body weight: 71 kg (156 lb 8.2 oz)  Assessment   Diagnosis Status  1. Chronic low back pain (1ry area of Pain) (Right) w/o sciatica   2. Lumbar facet joint pain   3. Lumbar facet joint syndrome   4. Decreased range of motion of lumbar spine   5. Postop check    Controlled Controlled Controlled   Updated Problems: No problems updated.  Plan of Care  Problem-specific:  Assessment and Plan            [name] has a current medication list which includes the following long-term medication(s): albuterol, albuterol, amlodipine, fluticasone, fluticasone, hydrochlorothiazide, ipratropium-albuterol, rosuvastatin, ozempic (1 mg/dose), and valsartan.  Pharmacotherapy (Medications Ordered): No orders of the defined types were placed in this  encounter.  Orders:  Orders Placed This Encounter  Procedures   LUMBAR FACET(MEDIAL  BRANCH NERVE BLOCK) MBNB    Diagnosis: Lumbar Facet Syndrome (M47.816); Lumbosacral Facet Syndrome (M47.817); Lumbar Facet Joint Pain (M54.59) Medical Necessity Statement: 1.Severe chronic axial low back pain causing functional impairment documented by ongoing pain scale assessments. 2.Pain present for longer than 3 months (Chronic) documented to have failed noninvasive conservative therapies. 3.Absence of untreated radiculopathy. 4.There is no radiological evidence of untreated fractures, tumor, infection, or deformity.  Physical Examination Findings: Positive Kemp Maneuver: (Y)  Positive Lumbar Hyperextension-Rotation provocative test: (Y)    Standing Status:   Standing    Number of Occurrences:   1    Expiration Date:   02/11/2024    Scheduling Instructions:     Procedure: Lumbar facet Block     Type: Medial Branch Block     Side: Bilateral     Purpose: Diagnostic     Level(s): L3-4, L4-5, L5-S1, and TBD by Fluoroscopic Mapping Facets (L2, L3, L4, L5, S1, and TBD Medial Branch)     Sedation: With Sedation.     Timeframe: PRN (patient will call)    Where will this procedure be performed?:   [facility]   Nursing Instructions:    Please complete this patient's postprocedure evaluation.    Scheduling Instructions:     Please complete this patient's postprocedure evaluation.   Follow-up plan:   Return for (PRN) (ECT): (B) L-FCT Blk #2.      Interventional Therapies  Risk Factors  Considerations  Medical Comorbidities:  MO (BMI>35)  COPD (BA, CB)  T2NIDDM  HTN     Planned  Pending:   Diagnostic bilateral (L3-4, L4-5, L5-S1) lumbar facet MBB #2    Under consideration:   Diagnostic bilateral (L3-4, L4-5, L5-S1) lumbar facet MBB #2  Neurosurgery consult secondary to L4-5 instability. Diagnostic CT vs MRI of thoracic spine to evaluate thoracic back pain and abdominal pain  (thoracic radiculitis/radiculopathy).   Completed:   Diagnostic bilateral (L3-4, L4-5, L5-S1) lumbar facet MBB x1 (07/23/2023) (100/100/55/55)  (04/20/2023) order for physical therapy entered.   Therapeutic  Palliative (PRN) options:   None established   Completed by other providers:   Diagnostic bilateral L3-4, L4-5 IA facet steroid inj. x1 (06/03/2022) (7-2/10) by Betsey Amen, MD (Dominika Regino Schultze, MD (Attending)) [facility] PM)       Recent Visits Date Type Provider Dept  07/23/23 Procedure visit Delano Metz, MD [facility]  06/24/23 Office Visit Delano Metz, MD [facility]  Showing recent visits within past 90 days and meeting all other requirements Today's Visits Date Type Provider Dept  08/11/23 Office Visit Delano Metz, MD [facility]  Showing today's visits and meeting all other requirements Future Appointments No visits were found meeting these conditions. Showing future appointments within next 90 days and meeting all other requirements  I discussed the assessment and treatment plan with the patient. The patient was provided an opportunity to ask questions and all were answered. The patient agreed with the plan and demonstrated an understanding of the instructions.  Patient advised to call back or seek an in-person evaluation if the symptoms or condition worsens.  Duration of encounter: 30 minutes.  Total time on encounter, as per AMA guidelines included both the face-to-face and non-face-to-face time personally spent by the physician and/or other qualified health care professional(s) on the day of the encounter (includes time in activities that require the physician or other qualified health care professional and does not include time in activities normally performed by clinical staff). Physician's time  may include the following activities when performed: Preparing to see the patient (e.g., pre-charting review of  records, searching for previously ordered imaging, lab work, and nerve conduction tests) Review of prior analgesic pharmacotherapies. Reviewing PMP Interpreting ordered tests (e.g., lab work, imaging, nerve conduction tests) Performing post-procedure evaluations, including interpretation of diagnostic procedures Obtaining and/or reviewing separately obtained history Performing a medically appropriate examination and/or evaluation Counseling and educating the patient/family/caregiver Ordering medications, tests, or procedures Referring and communicating with other health care professionals (when not separately reported) Documenting clinical information in the electronic or other health record Independently interpreting results (not separately reported) and communicating results to the patient/ family/caregiver Care coordination (not separately reported)  Note by: Brittany Done, MD Date: 08/11/2023; Time: 1:45 PM

## 2023-08-11 NOTE — Patient Instructions (Signed)

## 2023-08-11 NOTE — Progress Notes (Signed)
 Safety precautions to be maintained throughout the outpatient stay will include: orient to surroundings, keep bed in low position, maintain call bell within reach at all times, provide assistance with transfer out of bed and ambulation.

## 2023-08-18 ENCOUNTER — Encounter: Payer: Self-pay | Admitting: Gastroenterology
